# Patient Record
Sex: Female | Born: 1970
Health system: Southern US, Community
[De-identification: ages and names within clinical notes are randomized; demographics above are authoritative.]

## PROBLEM LIST (undated history)

## (undated) DIAGNOSIS — K5900 Constipation, unspecified: Secondary | ICD-10-CM

## (undated) DIAGNOSIS — F32A Depression, unspecified: Secondary | ICD-10-CM

## (undated) DIAGNOSIS — K635 Polyp of colon: Secondary | ICD-10-CM

## (undated) DIAGNOSIS — M1990 Unspecified osteoarthritis, unspecified site: Secondary | ICD-10-CM

## (undated) DIAGNOSIS — F329 Major depressive disorder, single episode, unspecified: Secondary | ICD-10-CM

## (undated) DIAGNOSIS — E039 Hypothyroidism, unspecified: Secondary | ICD-10-CM

## (undated) DIAGNOSIS — R7989 Other specified abnormal findings of blood chemistry: Secondary | ICD-10-CM

## (undated) DIAGNOSIS — T7840XA Allergy, unspecified, initial encounter: Secondary | ICD-10-CM

## (undated) DIAGNOSIS — F419 Anxiety disorder, unspecified: Secondary | ICD-10-CM

## (undated) DIAGNOSIS — M2550 Pain in unspecified joint: Secondary | ICD-10-CM

## (undated) DIAGNOSIS — J45909 Unspecified asthma, uncomplicated: Secondary | ICD-10-CM

## (undated) HISTORY — DX: Pain in unspecified joint: M25.50

## (undated) HISTORY — DX: Polyp of colon: K63.5

## (undated) HISTORY — DX: Constipation, unspecified: K59.00

## (undated) HISTORY — DX: Other specified abnormal findings of blood chemistry: R79.89

## (undated) HISTORY — PX: BREAST SURGERY: SHX581

## (undated) HISTORY — DX: Unspecified asthma, uncomplicated: J45.909

## (undated) HISTORY — DX: Depression, unspecified: F32.A

## (undated) HISTORY — DX: Unspecified osteoarthritis, unspecified site: M19.90

## (undated) HISTORY — PX: APPENDECTOMY: SHX54

## (undated) HISTORY — PX: ABDOMINAL HYSTERECTOMY: SHX81

## (undated) HISTORY — DX: Allergy, unspecified, initial encounter: T78.40XA

## (undated) HISTORY — DX: Anxiety disorder, unspecified: F41.9

---
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
## 2016-06-20 DIAGNOSIS — N63 Unspecified lump in unspecified breast: Secondary | ICD-10-CM | POA: Insufficient documentation

## 2017-05-30 DIAGNOSIS — N941 Unspecified dyspareunia: Secondary | ICD-10-CM | POA: Insufficient documentation

## 2018-09-14 DIAGNOSIS — F32A Depression, unspecified: Secondary | ICD-10-CM | POA: Insufficient documentation

## 2018-10-28 DIAGNOSIS — G4733 Obstructive sleep apnea (adult) (pediatric): Secondary | ICD-10-CM | POA: Insufficient documentation

## 2019-10-22 DIAGNOSIS — Z20822 Contact with and (suspected) exposure to covid-19: Secondary | ICD-10-CM | POA: Diagnosis not present

## 2019-10-22 DIAGNOSIS — R0982 Postnasal drip: Secondary | ICD-10-CM | POA: Diagnosis not present

## 2019-10-22 DIAGNOSIS — H6503 Acute serous otitis media, bilateral: Secondary | ICD-10-CM | POA: Diagnosis not present

## 2019-10-22 DIAGNOSIS — R0981 Nasal congestion: Secondary | ICD-10-CM | POA: Diagnosis not present

## 2019-11-04 DIAGNOSIS — Z1231 Encounter for screening mammogram for malignant neoplasm of breast: Secondary | ICD-10-CM | POA: Diagnosis not present

## 2019-11-05 DIAGNOSIS — Z79899 Other long term (current) drug therapy: Secondary | ICD-10-CM | POA: Diagnosis not present

## 2019-11-05 DIAGNOSIS — L732 Hidradenitis suppurativa: Secondary | ICD-10-CM | POA: Diagnosis not present

## 2019-11-05 DIAGNOSIS — Z5181 Encounter for therapeutic drug level monitoring: Secondary | ICD-10-CM | POA: Diagnosis not present

## 2019-11-05 DIAGNOSIS — L439 Lichen planus, unspecified: Secondary | ICD-10-CM | POA: Diagnosis not present

## 2019-11-23 DIAGNOSIS — M9902 Segmental and somatic dysfunction of thoracic region: Secondary | ICD-10-CM | POA: Diagnosis not present

## 2019-11-23 DIAGNOSIS — M5413 Radiculopathy, cervicothoracic region: Secondary | ICD-10-CM | POA: Diagnosis not present

## 2019-11-23 DIAGNOSIS — M791 Myalgia, unspecified site: Secondary | ICD-10-CM | POA: Diagnosis not present

## 2019-11-23 DIAGNOSIS — M9901 Segmental and somatic dysfunction of cervical region: Secondary | ICD-10-CM | POA: Diagnosis not present

## 2019-11-25 DIAGNOSIS — R928 Other abnormal and inconclusive findings on diagnostic imaging of breast: Secondary | ICD-10-CM | POA: Diagnosis not present

## 2019-12-07 DIAGNOSIS — M791 Myalgia, unspecified site: Secondary | ICD-10-CM | POA: Diagnosis not present

## 2019-12-07 DIAGNOSIS — M9901 Segmental and somatic dysfunction of cervical region: Secondary | ICD-10-CM | POA: Diagnosis not present

## 2019-12-07 DIAGNOSIS — M9902 Segmental and somatic dysfunction of thoracic region: Secondary | ICD-10-CM | POA: Diagnosis not present

## 2019-12-07 DIAGNOSIS — M5413 Radiculopathy, cervicothoracic region: Secondary | ICD-10-CM | POA: Diagnosis not present

## 2019-12-14 DIAGNOSIS — M9902 Segmental and somatic dysfunction of thoracic region: Secondary | ICD-10-CM | POA: Diagnosis not present

## 2019-12-14 DIAGNOSIS — M791 Myalgia, unspecified site: Secondary | ICD-10-CM | POA: Diagnosis not present

## 2019-12-14 DIAGNOSIS — M9901 Segmental and somatic dysfunction of cervical region: Secondary | ICD-10-CM | POA: Diagnosis not present

## 2019-12-14 DIAGNOSIS — M5413 Radiculopathy, cervicothoracic region: Secondary | ICD-10-CM | POA: Diagnosis not present

## 2019-12-23 DIAGNOSIS — E7800 Pure hypercholesterolemia, unspecified: Secondary | ICD-10-CM | POA: Diagnosis not present

## 2020-02-07 DIAGNOSIS — J441 Chronic obstructive pulmonary disease with (acute) exacerbation: Secondary | ICD-10-CM | POA: Diagnosis not present

## 2020-02-07 DIAGNOSIS — R05 Cough: Secondary | ICD-10-CM | POA: Diagnosis not present

## 2020-02-07 DIAGNOSIS — Z20822 Contact with and (suspected) exposure to covid-19: Secondary | ICD-10-CM | POA: Diagnosis not present

## 2020-02-10 ENCOUNTER — Other Ambulatory Visit: Payer: Self-pay

## 2020-02-10 ENCOUNTER — Encounter: Payer: Self-pay | Admitting: Osteopathic Medicine

## 2020-02-10 ENCOUNTER — Ambulatory Visit (INDEPENDENT_AMBULATORY_CARE_PROVIDER_SITE_OTHER): Payer: BC Managed Care – PPO | Admitting: Osteopathic Medicine

## 2020-02-10 ENCOUNTER — Ambulatory Visit (INDEPENDENT_AMBULATORY_CARE_PROVIDER_SITE_OTHER): Payer: BC Managed Care – PPO

## 2020-02-10 VITALS — BP 120/77 | HR 72 | Temp 98.0°F | Ht 63.0 in | Wt 224.0 lb

## 2020-02-10 DIAGNOSIS — Z800 Family history of malignant neoplasm of digestive organs: Secondary | ICD-10-CM | POA: Insufficient documentation

## 2020-02-10 DIAGNOSIS — E039 Hypothyroidism, unspecified: Secondary | ICD-10-CM

## 2020-02-10 DIAGNOSIS — Z86010 Personal history of colonic polyps: Secondary | ICD-10-CM

## 2020-02-10 DIAGNOSIS — G8929 Other chronic pain: Secondary | ICD-10-CM

## 2020-02-10 DIAGNOSIS — E782 Mixed hyperlipidemia: Secondary | ICD-10-CM

## 2020-02-10 DIAGNOSIS — M47816 Spondylosis without myelopathy or radiculopathy, lumbar region: Secondary | ICD-10-CM | POA: Insufficient documentation

## 2020-02-10 DIAGNOSIS — M545 Low back pain, unspecified: Secondary | ICD-10-CM

## 2020-02-10 DIAGNOSIS — R635 Abnormal weight gain: Secondary | ICD-10-CM

## 2020-02-10 DIAGNOSIS — J4520 Mild intermittent asthma, uncomplicated: Secondary | ICD-10-CM

## 2020-02-10 DIAGNOSIS — F32A Depression, unspecified: Secondary | ICD-10-CM

## 2020-02-10 DIAGNOSIS — R6889 Other general symptoms and signs: Secondary | ICD-10-CM

## 2020-02-10 DIAGNOSIS — Z90710 Acquired absence of both cervix and uterus: Secondary | ICD-10-CM | POA: Insufficient documentation

## 2020-02-10 DIAGNOSIS — Z87891 Personal history of nicotine dependence: Secondary | ICD-10-CM

## 2020-02-10 DIAGNOSIS — M5030 Other cervical disc degeneration, unspecified cervical region: Secondary | ICD-10-CM | POA: Diagnosis not present

## 2020-02-10 DIAGNOSIS — F419 Anxiety disorder, unspecified: Secondary | ICD-10-CM

## 2020-02-10 DIAGNOSIS — M533 Sacrococcygeal disorders, not elsewhere classified: Secondary | ICD-10-CM | POA: Diagnosis not present

## 2020-02-10 DIAGNOSIS — F329 Major depressive disorder, single episode, unspecified: Secondary | ICD-10-CM

## 2020-02-10 MED ORDER — BUDESONIDE-FORMOTEROL FUMARATE 160-4.5 MCG/ACT IN AERO
2.0000 | INHALATION_SPRAY | Freq: Two times a day (BID) | RESPIRATORY_TRACT | 3 refills | Status: DC
Start: 2020-02-10 — End: 2022-05-14

## 2020-02-10 MED ORDER — LEVOTHYROXINE SODIUM 100 MCG PO TABS: 100.0000 ug | ORAL_TABLET | Freq: Every day | ORAL | 1 refills | Status: AC

## 2020-02-10 MED ORDER — ALBUTEROL SULFATE HFA 108 (90 BASE) MCG/ACT IN AERS: 1.0000 | INHALATION_SPRAY | Freq: Four times a day (QID) | RESPIRATORY_TRACT | 99 refills | Status: DC | PRN

## 2020-02-10 NOTE — Progress Notes (Signed)
Rakia Frayne is a 49 y.o. female who presents to  [place] at [facility]  today, 02/10/20, seeking care for the following: . New to establish  . Neck and back pain - chronic  . Asthma - recently quit smoking!      ASSESSMENT & PLAN with other pertinent history/findings:  The primary encounter diagnosis was Hypothyroidism, unspecified type. Diagnoses of History of colon polyps, Anxiety and depression, Mild intermittent asthma without complication, Abnormal weight gain, Disc disease, degenerative, cervical, Heat intolerance, Chronic low back pain, unspecified back pain laterality, unspecified whether sciatica present, Former smoker, H/O: hysterectomy, Family history of colon cancer in father, and Mixed hyperlipidemia were also pertinent to this visit.     Patient Instructions  Neck / Back  Xray lumbar spine and SI joints today  Physical therapy referral for dry needling  Consider MRI C-spine (neck) and L-spine (lower back)   Asthma Stay off cigarettes! Congrats on quitting!  Refilled ProAir (or any albuterol is ok) Starting Symbicort inhaler twice daily for 6 weeks  At specialists / mammogram / colonoscopy: Please ask them to send me records!  Our fax: [phone]   Welcome to [facility]!       Orders Placed This Encounter  Procedures  . DG Lumbar Spine Complete  . DG Si Joints  . Ambulatory referral to Physical Therapy    Meds ordered this encounter  Medications  . budesonide-formoterol (SYMBICORT) 160-4.5 MCG/ACT inhaler    Sig: Inhale 2 puffs into the lungs 2 (two) times daily.    Dispense:  1 Inhaler    Refill:  3  . albuterol (VENTOLIN HFA) 108 (90 Base) MCG/ACT inhaler    Sig: Inhale 1-2 puffs into the lungs every 6 (six) hours as needed for wheezing or shortness of breath.    Dispense:  18 g    Refill:  99  . levothyroxine (SYNTHROID) 100 MCG tablet    Sig: Take 1 tablet (100 mcg total) by  mouth daily before breakfast.    Dispense:  90 tablet    Refill:  1       Follow-up instructions: Return for recheck breathing in 6 weeks, othrewise as needed for neck/back if no better, annual w/ labs 10/2020.                                         BP 120/77 (BP Location: Right Arm, Patient Position: Sitting)   Pulse 72   Temp 98 F (36.7 C)   Ht 5\' 3"  (1.6 m)   Wt 224 lb (101.6 kg)   SpO2 98%   BMI 39.68 kg/m   Current Meds  Medication Sig  . acetaminophen (TYLENOL) 500 MG tablet Take 500 mg by mouth every 6 (six) hours as needed.  [name] estradiol (VIVELLE-DOT) 0.05 MG/24HR patch Place 1 patch onto the skin once a week.  . Fish Oil-Cholecalciferol (FISH OIL + D3) 1000-1000 MG-UNIT CAPS Take by mouth.  [name] ibuprofen (ADVIL) 200 MG tablet Take 200 mg by mouth every 6 (six) hours as needed.  [name] levothyroxine (SYNTHROID) 100 MCG tablet Take 1 tablet (100 mcg total) by mouth daily before breakfast.  . [DISCONTINUED] levothyroxine (SYNTHROID) 100 MCG tablet Take 100 mcg by mouth daily before breakfast.    No results found for this or any previous visit (from the past 72 hour(s)).  No results  found.  Depression screen [facility] 2/9 02/10/2020  Decreased Interest 0  Down, Depressed, Hopeless 0  PHQ - 2 Score 0  Altered sleeping 1  Tired, decreased energy 1  Change in appetite 1  Feeling bad or failure about yourself  0  Trouble concentrating 0  Moving slowly or fidgety/restless 0  Suicidal thoughts 0  PHQ-9 Score 3  Difficult doing work/chores Not difficult at all    GAD 7 : Generalized Anxiety Score 02/10/2020  Nervous, Anxious, on Edge 1  Control/stop worrying 0  Worry too much - different things 1  Trouble relaxing 0  Restless 0  Easily annoyed or irritable 0  Afraid - awful might happen 0  Total GAD 7 Score 2  Anxiety Difficulty Not difficult at all    Constitutional:  . VSS, see nurse notes . General Appearance: alert,  well-developed, well-nourished, NAD Eyes: [name] Normal lids and conjunctive, non-icteric sclera . PERRLA Ears, Nose, Mouth, Throat: . Normal appearanc Neck: . No masses, trachea midline . No thyroid enlargement/tenderness/mass appreciated Respiratory: . Normal respiratory effort . (+)diffuse wheeze in all lung fields  Cardiovascular: . S1/S2 normal, no murmur/rub/gallop auscultated . No lower extremity edema Gastrointestinal: . Nontender, no masses . No hepatomegaly, no splenomegaly . No hernia appreciated Musculoskeletal:  . Gait normal . No clubbing/cyanosis of digits Neurological: . No cranial nerve deficit on limited exam . Motor and sensation intact and symmetric Psychiatric: . Normal judgment/insight . Normal mood and affect   All questions at time of visit were answered - patient instructed to contact office with any additional concerns or updates.  ER/RTC precautions were reviewed with the patient.  Please note: voice recognition software was used to produce this document, and typos may escape review. Please contact Dr. Lyn Hollingshead for any needed clarifications.

## 2020-02-10 NOTE — Patient Instructions (Signed)
Neck / Back  Xray lumbar spine and SI joints today  Physical therapy referral for dry needling  Consider MRI C-spine (neck) and L-spine (lower back)   Asthma Stay off cigarettes! Congrats on quitting!  Refilled ProAir (or any albuterol is ok) Starting Symbicort inhaler twice daily for 6 weeks  At specialists / mammogram / colonoscopy: Please ask them to send me records!  Our fax: [phone]   Welcome to [facility]!

## 2020-02-16 ENCOUNTER — Encounter: Payer: Self-pay | Admitting: Osteopathic Medicine

## 2020-02-17 DIAGNOSIS — K635 Polyp of colon: Secondary | ICD-10-CM | POA: Diagnosis not present

## 2020-02-17 DIAGNOSIS — D125 Benign neoplasm of sigmoid colon: Secondary | ICD-10-CM | POA: Diagnosis not present

## 2020-02-17 DIAGNOSIS — Z86010 Personal history of colonic polyps: Secondary | ICD-10-CM | POA: Diagnosis not present

## 2020-02-17 LAB — HM COLONOSCOPY

## 2020-02-22 ENCOUNTER — Other Ambulatory Visit: Payer: Self-pay

## 2020-02-22 ENCOUNTER — Encounter: Payer: Self-pay | Admitting: Rehabilitative and Restorative Service Providers"

## 2020-02-22 ENCOUNTER — Ambulatory Visit (INDEPENDENT_AMBULATORY_CARE_PROVIDER_SITE_OTHER): Payer: BC Managed Care – PPO | Admitting: Rehabilitative and Restorative Service Providers"

## 2020-02-22 DIAGNOSIS — M545 Low back pain, unspecified: Secondary | ICD-10-CM

## 2020-02-22 DIAGNOSIS — M542 Cervicalgia: Secondary | ICD-10-CM

## 2020-02-22 DIAGNOSIS — M6281 Muscle weakness (generalized): Secondary | ICD-10-CM

## 2020-02-22 DIAGNOSIS — R293 Abnormal posture: Secondary | ICD-10-CM

## 2020-02-22 DIAGNOSIS — G8929 Other chronic pain: Secondary | ICD-10-CM

## 2020-02-22 DIAGNOSIS — R29898 Other symptoms and signs involving the musculoskeletal system: Secondary | ICD-10-CM | POA: Diagnosis not present

## 2020-02-22 NOTE — Patient Instructions (Signed)
Access Code: [email or web address]: [email or web address]: 06/15/2021Prepared by: Jerret Mcbane HoltExercises  Hip Flexor Stretch at Edge of Bed - 2 x daily - 7 x weekly - 3 reps - 1 sets - 30 sec hold  Supine Piriformis Stretch with Leg Straight - 2 x daily - 7 x weekly - 3 reps - 1 sets - 30 sec hold  Supine ITB Stretch with Strap - 2 x daily - 7 x weekly - 3 reps - 1 sets - 30 sec hold  Prone Press Up - 2 x daily - 7 x weekly - 1 sets - 3 reps - 2-3 sec hold  Standing Lumbar Extension - 4-5 x daily - 7 x weekly - 1 sets - 3 reps - 2-3 sec hold Patient Education  TENS Unit

## 2020-02-22 NOTE — Therapy (Signed)
[facility] [place] [address] [place], [place], [address] Phone: [phone]   Fax:  [phone]  Physical Therapy Evaluation  Patient Details  Name: Alexandra Greer MRN: [id number] Date of Birth: 1971-04-24 Referring Provider (PT): Dr Sunnie Nielsen    Encounter Date: 02/22/2020   PT End of Session - 02/22/20 0924    Visit Number 1    Number of Visits 12    Date for PT Re-Evaluation 04/04/20    PT Start Time 0842    PT Stop Time 0939    PT Time Calculation (min) 57 min    Activity Tolerance Patient tolerated treatment well           Past Medical History:  Diagnosis Date  . Anxiety   . Asthma   . Colon polyps   . Depression   . Low thyroid stimulating hormone (TSH) level     History reviewed. No pertinent surgical history.  There were no vitals filed for this visit.    Subjective Assessment - 02/22/20 0845    Subjective Patient reports onset of neck pain in 2012 with no known injury. Pain has persisted with no significant changes. She has episodic flare ups with increased pain radiating into her head. Stress increases neck pain. LBP present for the past 2 years with no known injury. 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    Pertinent History C-section; lumpectomy    Diagnostic tests xrays    Patient Stated Goals find some relief for LBP and help manage neck pain    Currently in Pain? Yes    Pain Score 0-No pain    Pain Location Neck    Pain Orientation Right;Posterior;Upper    Pain Descriptors / Indicators Sharp;Dull    Pain Type Chronic pain    Pain Radiating Towards headaches    Pain Onset More than a month ago    Pain Frequency Intermittent    Aggravating Factors  starts when she awakens but not daily    Pain Relieving Factors ice packs; OTC meds    Multiple Pain Sites Yes    Pain Score 5   increased with walking   Pain Location Back    Pain Orientation Right;Left;Posterior;Lower     Pain Descriptors / Indicators Burning;Throbbing    Pain Type Chronic pain    Pain Radiating Towards Rt SI - across bilat hips radiates Rt to Lt    Pain Onset More than a month ago    Pain Frequency Intermittent    Aggravating Factors  standing > 2-3 min; walking > 3-4 min; bending; lifting; squatting    Pain Relieving Factors sitting              [facility] PT Assessment - 02/22/20 0001      Assessment   Medical Diagnosis Cervicalgia; LBP     Referring Provider (PT) Dr Sunnie Nielsen     Onset Date/Surgical Date 02/01/20   neck pain since 2012; LBP x 2 yrs    Hand Dominance Right    Next MD Visit 03/14/20    Prior Therapy for neck ~ 1 yr ago       Precautions   Precautions None      Balance Screen   Has the patient fallen in the past 6 months No    Has the patient had a decrease in activity level because of a fear of falling?  No    Is the patient reluctant to leave their home  because of a fear of falling?  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     Leisure household chores; getting ready to move; otherwise sedentary - does like to "swim" in pool 1-2 days/week (just floating and relaxing)       Observation/Other Assessments   Focus on Therapeutic Outcomes (FOTO)  37% limitation       Sensation   Additional Comments WFL's       Posture/Postural Control   Posture Comments head forward; shoulders rounded and elevated; flexed forward at hips; Rt knee flexed       AROM   Cervical Flexion 58   pulling upper trap Rt    Cervical Extension 41    Cervical - Right Side Bend 36    Cervical - Left Side Bend 36   pulling Rt shoulder blade   Cervical - Right Rotation 60    Cervical - Left Rotation 67    Lumbar Flexion 90% pain with  bending and return to stand    Lumbar Extension 60% decreased pain     Lumbar - Right Side Bend 85% pain Rt SI/LB    Lumbar - Left Side Bend 85% no pain     Lumbar - Right Rotation 55% no pain     Lumbar - Left Rotation 55% no pain       Strength   Right/Left Shoulder --   WFL's bilat    Right Hip Flexion 4+/5    Right Hip Extension 4/5   painful    Right Hip ABduction 4+/5    Left Hip Flexion 4+/5    Left Hip Extension 4+/5    Left Hip ABduction 4+/5      Flexibility   Soft Tissue Assessment /Muscle Length --   tight hip flexors bilat    Hamstrings WFL's     Quadriceps WFL's - pulling in Rt SI area with Lt knee flexion     ITB tight bilat     Piriformis tight bilat       Palpation   Spinal mobility hypomobility lumbar spine with CPA mobs     SI assessment  discomfort with palpation through the Rt SI area     Palpation comment tightness Rt QL/lats; lumbar paraspinals       Special Tests   Other special tests (-) SLR; FABERS       Balance   Balance Assessed --   SLS ~ 10 sec each LE                      Objective measurements completed on examination: See above findings.       [place] Adult PT Treatment/Exercise - 02/22/20 0001      Self-Care   Self-Care --   initiated back care eduction     Lumbar Exercises: Stretches   Hip Flexor Stretch Right;Left;3 reps;30 seconds   thomas position bringing bilat knees to chest 1st   ITB Stretch Right;Left;3 reps;30 seconds   supine with strap    Piriformis Stretch Right;Left;3 reps;30 seconds   supine travell with strap      Cryotherapy   Number Minutes Cryotherapy 10 Minutes    Cryotherapy Location Lumbar Spine;Hip  Type of Cryotherapy Ice pack      Electrical Stimulation   Electrical Stimulation Location bilat lumbar to hips    Electrical Stimulation Action IFC    Electrical Stimulation Parameters to tolerance     Electrical Stimulation Goals Pain;Tone                  PT Education - 02/22/20  4132    Education Details HEP; TENS; POC    Person(s) Educated Patient    Methods Explanation;Demonstration;Tactile cues;Verbal cues;Handout    Comprehension Verbalized understanding;Returned demonstration;Verbal cues required;Tactile cues required               PT Long Term Goals - 02/22/20 1301      PT LONG TERM GOAL #1   Title Increase spinal mobilty and trunk ROM with patient demonstrating WFL's and minimal to no pain with lumbar motions    Time 6    Period Weeks    Status New    Target Date 04/04/20      PT LONG TERM GOAL #2   Title Increase LE strength 1/2 to 1 muscle grade    Time 6    Period Weeks    Status New    Target Date 04/04/20      PT LONG TERM GOAL #3   Title Decrease frequency, intensity, or duration of pain by 25-50% allowing patient to participate in exercise program for at leat 20 min on a daily basis with minimal to no increase in pain    Time 6    Period Weeks    Status New    Target Date 04/04/20      PT LONG TERM GOAL #4   Title Patient to demonstrate increased cervical ROM by 3-7 degrees in lateral flexion and rotation with no change in pain    Time 6    Period Weeks    Status New    Target Date 04/04/20      PT LONG TERM GOAL #5   Title Improve posture and alignment with patient to demonstrate improved upright posture with posterior shoulder girdle musculature engaged    Time 6    Period Weeks    Status New    Target Date 04/04/20      Additional Long Term Goals   Additional Long Term Goals Yes      PT LONG TERM GOAL #6   Title Independent in aquatic exercise program to transition to community or private pool    Time 6    Period Weeks    Status New    Target Date 04/04/20      PT LONG TERM GOAL #7   Title Independent in HEP    Time 6    Period Weeks    Status New    Target Date 04/04/20      PT LONG TERM GOAL #8   Title Improve FOTO to </= 35% limitation    Time 6    Period Weeks    Status New    Target Date 04/04/20                    Plan - 02/22/20 1253    Clinical Impression Statement Patient presents with c/o chronic pain in neck and low back. Neck pain has been present for ~ 10 years and LBP for ~ 2 years with no known cause of pain. She has intermittent symptoms with very limited functional abilities. Patient has poor posture and alignment; limited  spinal mobility and ROM; muscular tightness; core and LE weakness; muscular tightness to palpation; pain; sedentary lifestyle; limited ADL's and funcitonal activity level. Patient will benefit from PT to address problems identified.    Personal Factors and Comorbidities Behavior Pattern;Comorbidity 1;Comorbidity 2;Comorbidity 3+    Comorbidities chronic pain; obesity; sedentary lifestyle    Examination-Activity Limitations Locomotion Level;Stand;Squat;Sleep;Lift;Bend    Examination-Participation Restrictions Cleaning;Community Activity;Meal Prep;Yard Work    Conservation officer, historic buildings Evolving/Moderate complexity    Clinical Decision Making Moderate    Rehab Potential Good    PT Frequency 2x / week    PT Duration 6 weeks    PT Treatment/Interventions Patient/family education;ADLs/Self Care Home Management;Cryotherapy;Electrical Stimulation;Iontophoresis 4mg /ml Dexamethasone;Moist Heat;Traction;Gait training;Functional mobility training;Therapeutic activities;Therapeutic exercise;Balance training;Neuromuscular re-education;Manual techniques;Dry needling;Taping    PT Next Visit Plan review HEP; further assess SI as indicated; progress with stretching; strengthening; core stabilization; postural correction; back care/spine eduction; aquatic therapy(has access to pool); manual work and modalities as indicated (trial of estim in clinic for evaluation for TENS unit at home)    PT Home Exercise Plan [facility]    Recommended Other Services nutritionist; counselor for pain management    Consulted and Agree with Plan of Care Patient           Patient  will benefit from skilled therapeutic intervention in order to improve the following deficits and impairments:  Decreased range of motion, Increased fascial restricitons, Obesity, Decreased activity tolerance, Pain, Hypomobility, Impaired flexibility, Improper body mechanics, Decreased mobility, Decreased strength, Postural dysfunction  Visit Diagnosis: Cervicalgia - Plan: PT plan of care cert/re-cert  Chronic bilateral low back pain without sciatica - Plan: PT plan of care cert/re-cert  Other symptoms and signs involving the musculoskeletal system - Plan: PT plan of care cert/re-cert  Abnormal posture - Plan: PT plan of care cert/re-cert  Muscle weakness (generalized) - Plan: PT plan of care cert/re-cert     Problem List Patient Active Problem List   Diagnosis Date Noted  . Family history of colon cancer in father 02/10/2020  . H/O: hysterectomy 02/10/2020  . Former smoker 02/10/2020  . Chronic low back pain 02/10/2020  . Disc disease, degenerative, cervical 02/10/2020  . Mild intermittent asthma without complication 02/10/2020  . Mild obstructive sleep apnea 10/28/2018  . Anxiety and depression 09/14/2018  . Dyspareunia, female 05/30/2017  . Subcutaneous nodule of breast 06/20/2016  . Hypothyroidism 09/12/2011    Alexandra Greer 11/10/2011 PT, MPH  02/22/2020, 1:10 PM  [facility] [address] [place], [place], [place] Phone: [phone]   Fax:  [phone]  Name: Alexandra Greer MRN: [id number] Platt Date of Birth: 1971/01/09

## 2020-02-24 ENCOUNTER — Ambulatory Visit (INDEPENDENT_AMBULATORY_CARE_PROVIDER_SITE_OTHER): Payer: BC Managed Care – PPO | Admitting: Physical Therapy

## 2020-02-24 ENCOUNTER — Encounter: Payer: Self-pay | Admitting: Osteopathic Medicine

## 2020-02-24 ENCOUNTER — Other Ambulatory Visit: Payer: Self-pay

## 2020-02-24 DIAGNOSIS — R293 Abnormal posture: Secondary | ICD-10-CM | POA: Diagnosis not present

## 2020-02-24 DIAGNOSIS — M545 Low back pain: Secondary | ICD-10-CM

## 2020-02-24 DIAGNOSIS — M542 Cervicalgia: Secondary | ICD-10-CM | POA: Diagnosis not present

## 2020-02-24 DIAGNOSIS — R29898 Other symptoms and signs involving the musculoskeletal system: Secondary | ICD-10-CM | POA: Diagnosis not present

## 2020-02-24 DIAGNOSIS — M6281 Muscle weakness (generalized): Secondary | ICD-10-CM

## 2020-02-24 DIAGNOSIS — G8929 Other chronic pain: Secondary | ICD-10-CM

## 2020-02-24 NOTE — Patient Instructions (Addendum)
Sleeping on Back  Place pillow under knees. A pillow with cervical support and a roll around waist are also helpful. Copyright  VHI. All rights reserved.  Sleeping on Side Place pillow between knees. Use cervical support under neck and a roll around waist as needed. Copyright  VHI. All rights reserved.   Sleeping on Stomach   If this is the only desirable sleeping position, place pillow under lower legs, and under stomach or chest as needed.  Posture - Sitting   Sit upright, head facing forward. Try using a roll to support lower back. Keep shoulders relaxed, and avoid rounded back. Keep hips level with knees. Avoid crossing legs for long periods. Stand to Sit / Sit to Stand   To sit: Bend knees to lower self onto front edge of chair, then scoot back on seat. To stand: Reverse sequence by placing one foot forward, and scoot to front of seat. Use rocking motion to stand up.   Work Height and Reach  Ideal work height is no more than 2 to 4 inches below elbow level when standing, and at elbow level when sitting. Reaching should be limited to arm's length, with elbows slightly bent.  Bending  Bend at hips and knees, not back. Keep feet shoulder-width apart.    Posture - Standing   Good posture is important. Avoid slouching and forward head thrust. Maintain curve in low back and align ears over shoul- ders, hips over ankles.  Alternating Positions   Alternate tasks and change positions frequently to reduce fatigue and muscle tension. Take rest breaks. Computer Work   Position work to face forward. Use proper work and seat height. Keep shoulders back and down, wrists straight, and elbows at right angles. Use chair that provides full back support. Add footrest and lumbar roll as needed.  Getting Into / Out of Car  Lower self onto seat, scoot back, then bring in one leg at a time. Reverse sequence to get out.  Dressing  Lie on back to pull socks or slacks over feet, or sit  and bend leg while keeping back straight.    Housework - Sink  Place one foot on ledge of cabinet under sink when standing at sink for prolonged periods.   Pushing / Pulling  Pushing is preferable to pulling. Keep back in proper alignment, and use leg muscles to do the work.  Deep Squat   Squat and lift with both arms held against upper trunk. Tighten stomach muscles without holding breath. Use smooth movements to avoid jerking.  Avoid Twisting   Avoid twisting or bending back. Pivot around using foot movements, and bend at knees if needed when reaching for articles.  Carrying Luggage   Distribute weight evenly on both sides. Use a cart whenever possible. Do not twist trunk. Move body as a unit.   Lifting Principles .Maintain proper posture and head alignment. .Slide object as close as possible before lifting. .Move obstacles out of the way. .Test before lifting; ask for help if too heavy. .Tighten stomach muscles without holding breath. .Use smooth movements; do not jerk. .Use legs to do the work, and pivot with feet. .Distribute the work load symmetrically and close to the center of trunk. .Push instead of pull whenever possible.   Ask For Help   Ask for help and delegate to others when possible. Coordinate your movements when lifting together, and maintain the low back curve.  Log Roll   Lying on back, bend left knee and place left   arm across chest. Roll all in one movement to the right. Reverse to roll to the left. Always move as one unit. Housework - Sweeping  Use long-handled equipment to avoid stooping.   Housework - Wiping  Position yourself as close as possible to reach work surface. Avoid straining your back.  Laundry - Unloading Wash   To unload small items at bottom of washer, lift leg opposite to arm being used to reach.  Gardening - Raking  Move close to area to be raked. Use arm movements to do the work. Keep back straight and avoid  twisting.     Cart  When reaching into cart with one arm, lift opposite leg to keep back straight.   Getting Into / Out of Bed  Lower self to lie down on one side by raising legs and lowering head at the same time. Use arms to assist moving without twisting. Bend both knees to roll onto back if desired. To sit up, start from lying on side, and use same move-ments in reverse. Housework - Vacuuming  Hold the vacuum with arm held at side. Step back and forth to move it, keeping head up. Avoid twisting.   Laundry - Armed forces training and education officer so that bending and twisting can be avoided.   Laundry - Unloading Dryer  Squat down to reach into clothes dryer or use a reacher.  Gardening - Weeding / Psychiatric nurse or Kneel. Knee pads may be helpful.                   Access Code: [email or web address]: [email or web address]: 06/17/2021Prepared by: [facility] [place]ern[place]villeExercises  Hip Flexor Stretch at [place] of Bed - 2 x daily - 7 x weekly - 2-3 reps - 1 sets - 20 sec hold  Seated Hip Flexor Stretch - 1 x daily - 7 x weekly - 3 sets - 2-3 reps - 20 sec hold  Supine ITB Stretch with Strap - 2 x daily - 7 x weekly - 3 reps - 1 sets - 30 sec hold  Prone Press Up - 2 x daily - 7 x weekly - 1 sets - 3 reps - 2-3 sec hold  Standing Lumbar Extension - 4-5 x daily - 7 x weekly - 1 sets - 3 reps - 2-3 sec hold  Seated Table Piriformis Stretch - 1 x daily - 7 x weekly - 3 sets - 10 reps - 20 hold  Doorway Pec Stretch at 90 Degrees Abduction - 1 x daily - 7 x weekly - 3 sets - 2-3 reps - 20 hold

## 2020-02-24 NOTE — Therapy (Signed)
[place], [place], [address] Phone: [phone]   Fax:  [phone]  Physical Therapy Treatment  Patient Details  Name: Alexandra Greer MRN: [id number] Date of Birth: [birth year] Referring Provider (PT): Dr Emeterio Reeve    Encounter Date: 02/24/2020   PT End of Session - 02/24/20 0946    Visit Number 2    Number of Visits 12    Date for PT Re-Evaluation 04/04/20    PT Start Time 0930    PT Stop Time 1020   MHP last 10 min   PT Time Calculation (min) 50 min    Activity Tolerance Patient tolerated treatment well    Behavior During Therapy [facility] for tasks assessed/performed           Past Medical History:  Diagnosis Date  . Anxiety   . Asthma   . Colon polyps   . Depression   . Low thyroid stimulating hormone (TSH) level     No past surgical history on file.  There were no vitals filed for this visit.   Subjective Assessment - 02/24/20 0934    Subjective Pt reports she had no pain when she walked in door.  She performed exercises 1x yesterday. 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    Currently in Pain? No/denies    Pain Score 0-No pain              [facility] PT Assessment - 02/24/20 0001      Assessment   Medical Diagnosis Cervicalgia; LBP     Referring Provider (PT) Dr Emeterio Reeve     Onset Date/Surgical Date 02/01/20   neck pain since 2012; LBP x 2 yrs    Hand Dominance Right    Next MD Visit 03/14/20    Prior Therapy for neck ~ 1 yr ago            [facility] Adult PT Treatment/Exercise - 02/24/20 0001      Self-Care   Self-Care Other Self-Care Comments    Other Self-Care Comments  Pt educated in self massage with ball to hip and lumbar musculature. Pt returned demo with cues.       Lumbar Exercises: Stretches   Hip Flexor Stretch Right;Left;2 reps;30 seconds   arm overhead     Standing Extension 1 rep;5 seconds    ITB Stretch Right;Left;2 reps;20 seconds   supine   Piriformis Stretch Right;Left;2 reps;30 seconds    Piriformis Stretch Limitations Trevell pinched in groin, switched to fig 4 holding opp leg and modified pigeon pose     Other Lumbar Stretch Exercise adductor stretch x 30 sec x 2 reps each leg in supine with strap      Lumbar Exercises: Aerobic   Nustep L5: 5 min (arms/legs)      Lumbar Exercises: Seated   Other Seated Lumbar Exercises reviewed sit to/from supine via log roll x 3 reps with cues      Shoulder Exercises: Stretch   Other Shoulder Stretches midlevel doorway stretch x 20 sec x 2 reps      Modalities   Modalities Moist Heat      Moist Heat Therapy   Number Minutes Moist Heat 10 Minutes    Moist Heat Location Lumbar Spine                  PT Education - 02/24/20  1320    Education Details posture/body Geologist, engineering; HEP updated    Person(s) Educated Patient    Methods Explanation;Handout    Comprehension Verbalized understanding               PT Long Term Goals - 02/22/20 1301      PT LONG TERM GOAL #1   Title Increase spinal mobilty and trunk ROM with patient demonstrating WFL's and minimal to no pain with lumbar motions    Time 6    Period Weeks    Status New    Target Date 04/04/20      PT LONG TERM GOAL #2   Title Increase LE strength 1/2 to 1 muscle grade    Time 6    Period Weeks    Status New    Target Date 04/04/20      PT LONG TERM GOAL #3   Title Decrease frequency, intensity, or duration of pain by 25-50% allowing patient to participate in exercise program for at leat 20 min on a daily basis with minimal to no increase in pain    Time 6    Period Weeks    Status New    Target Date 04/04/20      PT LONG TERM GOAL #4   Title Patient to demonstrate increased cervical ROM by 3-7 degrees in lateral flexion and rotation with no change in pain    Time 6    Period Weeks    Status New    Target  Date 04/04/20      PT LONG TERM GOAL #5   Title Improve posture and alignment with patient to demonstrate improved upright posture with posterior shoulder girdle musculature engaged    Time 6    Period Weeks    Status New    Target Date 04/04/20      Additional Long Term Goals   Additional Long Term Goals Yes      PT LONG TERM GOAL #6   Title Independent in aquatic exercise program to transition to community or private pool    Time 6    Period Weeks    Status New    Target Date 04/04/20      PT LONG TERM GOAL #7   Title Independent in HEP    Time 6    Period Weeks    Status New    Target Date 04/04/20      PT LONG TERM GOAL #8   Title Improve FOTO to </= 35% limitation    Time 6    Period Weeks    Status New    Target Date 04/04/20                 Plan - 02/24/20 1016    Clinical Impression Statement Pt tolerated all exercises well.  She had some increase in pain in Rt glute med when riding NuStep, reduced with self massage and MHP.  Pt would benefit from ed on body mechanics for elder care and initiate core strengthening.    Personal Factors and Comorbidities Behavior Pattern;Comorbidity 1;Comorbidity 2;Comorbidity 3+    Comorbidities chronic pain; obesity; sedentary lifestyle    Examination-Activity Limitations Locomotion Level;Stand;Squat;Sleep;Lift;Bend    Examination-Participation Restrictions Cleaning;Community Activity;Meal Prep;Yard Work    Conservation officer, historic buildings Evolving/Moderate complexity    Rehab Potential Good    PT Frequency 2x / week    PT Duration 6 weeks    PT Treatment/Interventions Patient/family education;ADLs/Self Care Home Management;Cryotherapy;Electrical Stimulation;Iontophoresis 4mg /ml Dexamethasone;Moist Heat;Traction;Gait training;Functional  mobility training;Therapeutic activities;Therapeutic exercise;Balance training;Neuromuscular re-education;Manual techniques;Dry needling;Taping    PT Next Visit Plan TENS instruction;  core strengthening.    PT Home Exercise Plan [email or web address]    Consulted and Agree with Plan of Care Patient           Patient will benefit from skilled therapeutic intervention in order to improve the following deficits and impairments:  Decreased range of motion, Increased fascial restricitons, Obesity, Decreased activity tolerance, Pain, Hypomobility, Impaired flexibility, Improper body mechanics, Decreased mobility, Decreased strength, Postural dysfunction  Visit Diagnosis: Cervicalgia  Chronic bilateral low back pain without sciatica  Other symptoms and signs involving the musculoskeletal system  Abnormal posture  Muscle weakness (generalized)     Problem List Patient Active Problem List   Diagnosis Date Noted  . Family history of colon cancer in father 02/10/2020  . H/O: hysterectomy 02/10/2020  . Former smoker 02/10/2020  . Chronic low back pain 02/10/2020  . Disc disease, degenerative, cervical 02/10/2020  . Mild intermittent asthma without complication 02/10/2020  . Mild obstructive sleep apnea 10/28/2018  . Anxiety and depression 09/14/2018  . Dyspareunia, female 05/30/2017  . Subcutaneous nodule of breast 06/20/2016  . Hypothyroidism 09/12/2011   Mayer Camel, PTA 02/24/20 1:29 PM  [facility] [place] [address] [place], [place], [address] Phone: [phone]   Fax:  [phone]  Name: Alexandra Greer MRN: [id number] Date of Birth: [birth year]

## 2020-02-25 DIAGNOSIS — Z8742 Personal history of other diseases of the female genital tract: Secondary | ICD-10-CM | POA: Diagnosis not present

## 2020-02-25 DIAGNOSIS — Z79899 Other long term (current) drug therapy: Secondary | ICD-10-CM | POA: Diagnosis not present

## 2020-02-29 ENCOUNTER — Encounter: Payer: Self-pay | Admitting: Osteopathic Medicine

## 2020-02-29 ENCOUNTER — Ambulatory Visit (INDEPENDENT_AMBULATORY_CARE_PROVIDER_SITE_OTHER): Payer: BC Managed Care – PPO | Admitting: Rehabilitative and Restorative Service Providers"

## 2020-02-29 ENCOUNTER — Encounter: Payer: Self-pay | Admitting: Rehabilitative and Restorative Service Providers"

## 2020-02-29 ENCOUNTER — Other Ambulatory Visit: Payer: Self-pay

## 2020-02-29 DIAGNOSIS — M542 Cervicalgia: Secondary | ICD-10-CM | POA: Diagnosis not present

## 2020-02-29 DIAGNOSIS — G8929 Other chronic pain: Secondary | ICD-10-CM

## 2020-02-29 DIAGNOSIS — R29898 Other symptoms and signs involving the musculoskeletal system: Secondary | ICD-10-CM | POA: Diagnosis not present

## 2020-02-29 DIAGNOSIS — R293 Abnormal posture: Secondary | ICD-10-CM | POA: Diagnosis not present

## 2020-02-29 DIAGNOSIS — M6281 Muscle weakness (generalized): Secondary | ICD-10-CM

## 2020-02-29 DIAGNOSIS — M545 Low back pain, unspecified: Secondary | ICD-10-CM

## 2020-02-29 NOTE — Patient Instructions (Signed)
Access Code: [email or web address] URL: [email or web address] Date: 02/29/2020 Prepared by: Margretta Ditty  Exercises Supine ITB Stretch with Strap - 2 x daily - 7 x weekly - 3 reps - 1 sets - 30 sec hold Hip Flexor Stretch at Edge of Bed - 2 x daily - 7 x weekly - 2-3 reps - 1 sets - 20 sec hold Supine Bridge - 2 x daily - 7 x weekly - 1 sets - 10 reps Sidelying Hip Abduction - 2 x daily - 7 x weekly - 1 sets - 10 reps Prone Press Up - 2 x daily - 7 x weekly - 1 sets - 3 reps - 2-3 sec hold Seated Table Piriformis Stretch - 1 x daily - 7 x weekly - 3 sets - 10 reps - 20 hold Seated Hip Flexor Stretch - 1 x daily - 7 x weekly - 3 sets - 2-3 reps - 20 sec hold Standing Lumbar Extension - 4-5 x daily - 7 x weekly - 1 sets - 3 reps - 2-3 sec hold Doorway Pec Stretch at 90 Degrees Abduction - 1 x daily - 7 x weekly - 3 sets - 2-3 reps - 20 hold

## 2020-02-29 NOTE — Therapy (Signed)
[place], [place], [address] Phone: [phone]   Fax:  [phone]  Physical Therapy Treatment  Patient Details  Name: Alexandra Greer MRN: [id number] Date of Birth: [birth year] Referring Provider (PT): Dr Emeterio Reeve    Encounter Date: 02/29/2020   PT End of Session - 02/29/20 1104    Visit Number 3    Number of Visits 12    Date for PT Re-Evaluation 04/04/20    PT Start Time 1101    PT Stop Time 1145    PT Time Calculation (min) 44 min    Activity Tolerance Patient tolerated treatment well    Behavior During Therapy [facility] for tasks assessed/performed           Past Medical History:  Diagnosis Date  . Anxiety   . Asthma   . Colon polyps   . Depression   . Low thyroid stimulating hormone (TSH) level     History reviewed. No pertinent surgical history.  There were no vitals filed for this visit.   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  She is not hurting today.  She worked for 3 hours yesterday and was more mindful of her mechanics.    Pertinent History C-section; lumpectomy    Patient Stated Goals find some relief for LBP and help manage neck pain    Currently in Pain? No/denies                             [facility] Adult PT Treatment/Exercise - 02/29/20 1107      Self-Care   Self-Care Other Self-Care Comments    Other Self-Care Comments  discussed body mechanics during washing dishes recommending standing with feet in staggered position or placing one foot at edge of cabinet to shift weight      Exercises   Exercises Lumbar;Neck      Neck Exercises: Supine   Neck Retraction 10 reps    Shoulder ABduction 10 reps    Shoulder Abduction Limitations red theraband      Lumbar Exercises: Stretches   Active Hamstring Stretch Right;Left;1 rep;30  seconds    Prone on Elbows Stretch 1 rep;30 seconds    Quad Stretch Right;Left;1 rep;60 seconds    Quad Stretch Limitations with PROM hip IR/ER    Piriformis Stretch Right;Left;1 rep;30 seconds    Other Lumbar Stretch Exercise QL stretch R side in L sidelying x 1 rep with passive overpressure      Lumbar Exercises: Aerobic   Nustep L5:  4 minutes UEs/LEs      Lumbar Exercises: Standing   Functional Squats 10 reps    Functional Squats Limitations squat with chair bump    Other Standing Lumbar Exercises Step ups x 10 reps "up/up, down/down"     Other Standing Lumbar Exercises Rolling a ball up/down the wall with core engaged for postural lengthening      Lumbar Exercises: Seated   Hip Flexion on Ball 10 reps    Hip Flexion on Ball Limitations challenging with shoes on (no tread)    Other Seated Lumbar Exercises Physioball sitting with alternating UE flexion      Lumbar Exercises: Supine   Bridge 10 reps    Bridge Limitations cues for pelvic tilt before lifting    Other Supine Lumbar Exercises hip hiking  and depression x 10 reps (ball under ankle) R and L sides      Lumbar Exercises: Sidelying   Hip Abduction 10 reps;3 seconds;Right;Left      Lumbar Exercises: Quadruped   Madcat/Old Horse 5 reps   elicits pain in R SI joint   Madcat/Old Horse Limitations limited mobility in low back      Manual Therapy   Manual Therapy Soft tissue mobilization    Soft tissue mobilization STM R QL in L sidelying to relax musculature; R glut med tennis ball roll against wall                  PT Education - 02/29/20 1220    Education Details progression of HEP to add strengthening (bridges and hip abduction)    Person(s) Educated Patient    Methods Explanation;Demonstration;Handout    Comprehension Verbalized understanding;Returned demonstration               PT Long Term Goals - 02/22/20 1301      PT LONG TERM GOAL #1   Title Increase spinal mobilty and trunk ROM with patient  demonstrating WFL's and minimal to no pain with lumbar motions    Time 6    Period Weeks    Status New    Target Date 04/04/20      PT LONG TERM GOAL #2   Title Increase LE strength 1/2 to 1 muscle grade    Time 6    Period Weeks    Status New    Target Date 04/04/20      PT LONG TERM GOAL #3   Title Decrease frequency, intensity, or duration of pain by 25-50% allowing patient to participate in exercise program for at leat 20 min on a daily basis with minimal to no increase in pain    Time 6    Period Weeks    Status New    Target Date 04/04/20      PT LONG TERM GOAL #4   Title Patient to demonstrate increased cervical ROM by 3-7 degrees in lateral flexion and rotation with no change in pain    Time 6    Period Weeks    Status New    Target Date 04/04/20      PT LONG TERM GOAL #5   Title Improve posture and alignment with patient to demonstrate improved upright posture with posterior shoulder girdle musculature engaged    Time 6    Period Weeks    Status New    Target Date 04/04/20      Additional Long Term Goals   Additional Long Term Goals Yes      PT LONG TERM GOAL #6   Title Independent in aquatic exercise program to transition to community or private pool    Time 6    Period Weeks    Status New    Target Date 04/04/20      PT LONG TERM GOAL #7   Title Independent in HEP    Time 6    Period Weeks    Status New    Target Date 04/04/20      PT LONG TERM GOAL #8   Title Improve FOTO to </= 35% limitation    Time 6    Period Weeks    Status New    Target Date 04/04/20                 Plan - 02/29/20 1224    Clinical  Impression Statement The patient notes a pattern of no pain with exercise, and pain when in one position for too long (driving or washing dishes).  She improves with movement.  Today's session focused on increasing strength for low back and upper back to improve postural support.  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    Comorbidities chronic pain; obesity; sedentary lifestyle    Rehab Potential Good    PT Frequency 2x / week    PT Duration 6 weeks    PT Treatment/Interventions Patient/family education;ADLs/Self Care Home Management;Cryotherapy;Electrical Stimulation;Iontophoresis 4mg /ml Dexamethasone;Moist Heat;Traction;Gait training;Functional mobility training;Therapeutic activities;Therapeutic exercise;Balance training;Neuromuscular re-education;Manual techniques;Dry needling;Taping    PT Next Visit Plan TENS instruction; core strengthening.  Progress core and LE strengthening, upper back strengthening and postural support    PT Home Exercise Plan [email or web address]    Consulted and Agree with Plan of Care Patient           Patient will benefit from skilled therapeutic intervention in order to improve the following deficits and impairments:  Decreased range of motion, Increased fascial restricitons, Obesity, Decreased activity tolerance, Pain, Hypomobility, Impaired flexibility, Improper body mechanics, Decreased mobility, Decreased strength, Postural dysfunction  Visit Diagnosis: Chronic bilateral low back pain without sciatica  Cervicalgia  Other symptoms and signs involving the musculoskeletal system  Abnormal posture  Muscle weakness (generalized)     Problem List Patient Active Problem List   Diagnosis Date Noted  . Family history of colon cancer in father 02/10/2020  . H/O: hysterectomy 02/10/2020  . Former smoker 02/10/2020  . Chronic low back pain 02/10/2020  . Disc disease, degenerative, cervical 02/10/2020  . Mild intermittent asthma without complication 02/10/2020  . Mild obstructive sleep apnea 10/28/2018  . Anxiety and depression 09/14/2018  . Dyspareunia, female 05/30/2017  . Subcutaneous nodule of breast 06/20/2016  . Hypothyroidism 09/12/2011    Tacari Repass, PT 02/29/2020, 12:26 PM  [facility] [address] [place], [place], [place] Phone: [phone]   Fax:  [phone]  Name: Alexandra Greer MRN: Trena Platt Date of Birth: [birth year]

## 2020-03-02 ENCOUNTER — Other Ambulatory Visit: Payer: Self-pay

## 2020-03-02 ENCOUNTER — Ambulatory Visit (INDEPENDENT_AMBULATORY_CARE_PROVIDER_SITE_OTHER): Payer: BC Managed Care – PPO | Admitting: Physical Therapy

## 2020-03-02 DIAGNOSIS — R29898 Other symptoms and signs involving the musculoskeletal system: Secondary | ICD-10-CM | POA: Diagnosis not present

## 2020-03-02 DIAGNOSIS — R293 Abnormal posture: Secondary | ICD-10-CM

## 2020-03-02 DIAGNOSIS — G8929 Other chronic pain: Secondary | ICD-10-CM

## 2020-03-02 DIAGNOSIS — M542 Cervicalgia: Secondary | ICD-10-CM | POA: Diagnosis not present

## 2020-03-02 DIAGNOSIS — M545 Low back pain: Secondary | ICD-10-CM

## 2020-03-02 DIAGNOSIS — M6281 Muscle weakness (generalized): Secondary | ICD-10-CM

## 2020-03-02 NOTE — Therapy (Signed)
[facility] [place] [address] [place], [place], [address] Phone: [phone]   Fax:  [phone]  Physical Therapy Treatment  Patient Details  Name: Alexandra Greer MRN: [id number] Date of Birth: 1971-04-24 Referring Provider (PT): Dr Sunnie Nielsen    Encounter Date: 03/02/2020   PT End of Session - 03/02/20 0943    Visit Number 4    Number of Visits 12    Date for PT Re-Evaluation 04/04/20    PT Start Time 0936    PT Stop Time 1015    PT Time Calculation (min) 39 min    Activity Tolerance Patient tolerated treatment well    Behavior During Therapy [facility] for tasks assessed/performed           Past Medical History:  Diagnosis Date  . Anxiety   . Asthma   . Colon polyps   . Depression   . Low thyroid stimulating hormone (TSH) level     No past surgical history on file.  There were no vitals filed for this visit.   Subjective Assessment - 03/02/20 0940    Subjective Pt reports she did a lot of walking the day of last session. She states she had a flare, "but it wasn't too bad".  She hasn't had time to do her exercises, but has been focusing on body mechanics with elder care.    Currently in Pain? No/denies    Pain Score 0-No pain   tightness, but not pain.             [facility] PT Assessment - 03/02/20 0001      Assessment   Medical Diagnosis Cervicalgia; LBP     Referring Provider (PT) Dr Sunnie Nielsen     Onset Date/Surgical Date 02/01/20   neck pain since 2012; LBP x 2 yrs    Hand Dominance Right    Next MD Visit 03/14/20    Prior Therapy for neck ~ 1 yr ago             [facility] Adult PT Treatment/Exercise - 03/02/20 0001      Self-Care   Other Self-Care Comments  discussed body mechanics for elder care, including guarding techniques of client in standing, for gait, and for transfers; pt returned demo with cues.   Pt educated on parameters, application and safety of TENS; pt verbalized understanding.        Lumbar Exercises: Stretches   Piriformis Stretch Right;Left;1 rep;20 seconds    Other Lumbar Stretch Exercise QL stretch R side, in sitting with Rt arm overhead x 2 rep x 30 sec       Lumbar Exercises: Aerobic   Nustep L5: 7 min (arms/legs)      Lumbar Exercises: Standing   Other Standing Lumbar Exercises Rolling a ball up/down the wall with core engaged for postural lengthening      Lumbar Exercises: Seated   Hip Flexion on Ball 10 reps;Strengthening   ab set, sitting on dynadisc   Sit to Stand 10 reps   with ab set   Other Seated Lumbar Exercises seated, slight post lean with ab set and alternating arms over head x 10 reps each      Lumbar Exercises: Sidelying   Hip Abduction 10 reps;3 seconds;Right;Left                  PT Education - 03/02/20 1228    Education Details TENS info, body mechanics.    Person(s) Educated Patient  Methods Explanation;Demonstration    Comprehension Verbalized understanding;Returned demonstration               PT Long Term Goals - 02/22/20 1301      PT LONG TERM GOAL #1   Title Increase spinal mobilty and trunk ROM with patient demonstrating WFL's and minimal to no pain with lumbar motions    Time 6    Period Weeks    Status New    Target Date 04/04/20      PT LONG TERM GOAL #2   Title Increase LE strength 1/2 to 1 muscle grade    Time 6    Period Weeks    Status New    Target Date 04/04/20      PT LONG TERM GOAL #3   Title Decrease frequency, intensity, or duration of pain by 25-50% allowing patient to participate in exercise program for at leat 20 min on a daily basis with minimal to no increase in pain    Time 6    Period Weeks    Status New    Target Date 04/04/20      PT LONG TERM GOAL #4   Title Patient to demonstrate increased cervical ROM by 3-7 degrees in lateral flexion and rotation with no change in pain    Time 6    Period Weeks    Status New    Target Date 04/04/20      PT LONG TERM GOAL #5    Title Improve posture and alignment with patient to demonstrate improved upright posture with posterior shoulder girdle musculature engaged    Time 6    Period Weeks    Status New    Target Date 04/04/20      Additional Long Term Goals   Additional Long Term Goals Yes      PT LONG TERM GOAL #6   Title Independent in aquatic exercise program to transition to community or private pool    Time 6    Period Weeks    Status New    Target Date 04/04/20      PT LONG TERM GOAL #7   Title Independent in HEP    Time 6    Period Weeks    Status New    Target Date 04/04/20      PT LONG TERM GOAL #8   Title Improve FOTO to </= 35% limitation    Time 6    Period Weeks    Status New    Target Date 04/04/20                 Plan - 03/02/20 1229    Clinical Impression Statement Pt tolerated today's exercises well, without production of pain.  Time spent educating pt on body mechanics/posture and TENS application.  Gradual improvement reported; progressing towards goals.    Comorbidities chronic pain; obesity; sedentary lifestyle    Rehab Potential Good    PT Frequency 2x / week    PT Duration 6 weeks    PT Treatment/Interventions Patient/family education;ADLs/Self Care Home Management;Cryotherapy;Electrical Stimulation;Iontophoresis 4mg /ml Dexamethasone;Moist Heat;Traction;Gait training;Functional mobility training;Therapeutic activities;Therapeutic exercise;Balance training;Neuromuscular re-education;Manual techniques;Dry needling;Taping    PT Next Visit Plan MMT/ROM for goal assessment.  Progress core and LE strengthening, upper back strengthening and postural support    PT Home Exercise Plan [email or web address]    Consulted and Agree with Plan of Care Patient           Patient will benefit from skilled therapeutic intervention in  order to improve the following deficits and impairments:  Decreased range of motion, Increased fascial restricitons, Obesity, Decreased activity tolerance, Pain,  Hypomobility, Impaired flexibility, Improper body mechanics, Decreased mobility, Decreased strength, Postural dysfunction  Visit Diagnosis: Chronic bilateral low back pain without sciatica  Cervicalgia  Other symptoms and signs involving the musculoskeletal system  Abnormal posture  Muscle weakness (generalized)     Problem List Patient Active Problem List   Diagnosis Date Noted  . Family history of colon cancer in father 02/10/2020  . H/O: hysterectomy 02/10/2020  . Former smoker 02/10/2020  . Chronic low back pain 02/10/2020  . Disc disease, degenerative, cervical 02/10/2020  . Mild intermittent asthma without complication 02/10/2020  . Mild obstructive sleep apnea 10/28/2018  . Anxiety and depression 09/14/2018  . Dyspareunia, female 05/30/2017  . Subcutaneous nodule of breast 06/20/2016  . Hypothyroidism 09/12/2011   Mayer Camel, PTA 03/02/20 1:08 PM  [facility] [place] [address] [place], [place], [address] Phone: [phone]   Fax:  [phone]  Name: Alexandra Greer MRN: [id number] Date of Birth: 03-31-1971

## 2020-03-07 ENCOUNTER — Encounter: Payer: BC Managed Care – PPO | Admitting: Physical Therapy

## 2020-03-09 ENCOUNTER — Ambulatory Visit (INDEPENDENT_AMBULATORY_CARE_PROVIDER_SITE_OTHER): Payer: BC Managed Care – PPO | Admitting: Physical Therapy

## 2020-03-09 ENCOUNTER — Other Ambulatory Visit: Payer: Self-pay

## 2020-03-09 DIAGNOSIS — E669 Obesity, unspecified: Secondary | ICD-10-CM | POA: Diagnosis not present

## 2020-03-09 DIAGNOSIS — R293 Abnormal posture: Secondary | ICD-10-CM | POA: Diagnosis not present

## 2020-03-09 DIAGNOSIS — M6281 Muscle weakness (generalized): Secondary | ICD-10-CM | POA: Diagnosis not present

## 2020-03-09 DIAGNOSIS — Z6839 Body mass index (BMI) 39.0-39.9, adult: Secondary | ICD-10-CM | POA: Diagnosis not present

## 2020-03-09 DIAGNOSIS — G8929 Other chronic pain: Secondary | ICD-10-CM

## 2020-03-09 DIAGNOSIS — R29898 Other symptoms and signs involving the musculoskeletal system: Secondary | ICD-10-CM

## 2020-03-09 DIAGNOSIS — M542 Cervicalgia: Secondary | ICD-10-CM

## 2020-03-09 DIAGNOSIS — M545 Low back pain: Secondary | ICD-10-CM | POA: Diagnosis not present

## 2020-03-09 NOTE — Therapy (Addendum)
[place] [address] [place], [place], [address] Phone: [phone]   Fax:  [phone]  Physical Therapy Treatment  Patient Details  Name: Alexandra Greer MRN: [id number] Date of Birth: 07-11-1971 Referring Provider (PT): Dr Emeterio Reeve    Encounter Date: 03/09/2020   PT End of Session - 03/09/20 1019    Visit Number 5    Number of Visits 12    Date for PT Re-Evaluation 04/04/20    PT Start Time 0930    PT Stop Time 1021    PT Time Calculation (min) 51 min    Activity Tolerance Patient tolerated treatment well;No increased pain    Behavior During Therapy WFL for tasks assessed/performed           Past Medical History:  Diagnosis Date  . Anxiety   . Asthma   . Colon polyps   . Depression   . Low thyroid stimulating hormone (TSH) level     No past surgical history on file.  There were no vitals filed for this visit.   Subjective Assessment - 03/09/20 0935    Subjective Pt reports her neck is bothering her this morning. She was putting away dishes and her back/neck began to bother her.  Stress of work and moving is also increasing her neck pain. She has been utilizing the tips to body mechanics with housework at job.    Patient Stated Goals find some relief for LBP and help manage neck pain    Currently in Pain? Yes    Pain Score 4     Pain Location Neck    Pain Orientation Right;Upper    Pain Descriptors / Indicators Dull    Aggravating Factors  sleep position    Pain Relieving Factors medicine, massage, ice              [facility] PT Assessment - 03/09/20 0001      Assessment   Medical Diagnosis Cervicalgia; LBP     Referring Provider (PT) Dr Emeterio Reeve     Onset Date/Surgical Date 02/01/20   neck pain since 2012; LBP x 2 yrs    Hand Dominance Right    Next MD Visit 03/14/20    Prior Therapy for neck ~ 1 yr ago             [facility] Adult PT Treatment/Exercise - 03/09/20 0001       Self-Care   Other Self-Care Comments  pt educated on self subocccipital release with massage blocks- trialed x 4 min      Neck Exercises: Seated   Other Seated Exercise leaning forward with forearms on thighs:  cervical flexion to neutral x 5, then cervical diagonals x 5 each way.       Neck Exercises: Supine   Neck Retraction 5 reps;5 secs    Other Supine Exercise snow angels x 5 reps    Other Supine Exercise hooklying: D2 flexion Rt/Lt x 10, horiz abdct x 10, bilat ER x 10 - all with red band.       Lumbar Exercises: Stretches   Passive Hamstring Stretch Right;Left;2 reps;30 seconds   supine with strap     Lumbar Exercises: Aerobic   Nustep L5: 5.5 min (arms/legs)      Shoulder Exercises: Stretch   Other Shoulder Stretches midlevel doorway x 20 sec x 2 reps; high level unilateral doorway stretch x 20 sec x 2 reps each side.       Moist Heat Therapy  Number Minutes Moist Heat 10 Minutes    Moist Heat Location Lumbar Spine      Cryotherapy   Number Minutes Cryotherapy 10 Minutes    Cryotherapy Location Cervical    Type of Cryotherapy Ice pack      Electrical Stimulation   Electrical Stimulation Location Rt upper trap, levator, and cervical paraspinals    Electrical Stimulation Action IFC    Electrical Stimulation Parameters intensity to tolerance x 10 min     Electrical Stimulation Goals Pain;Tone                  PT Education - 03/09/20 1022    Education Details HEP, and red band issued.    Person(s) Educated Patient    Methods Explanation;Demonstration;Verbal cues;Handout    Comprehension Returned demonstration;Verbalized understanding               PT Long Term Goals - 02/22/20 1301      PT LONG TERM GOAL #1   Title Increase spinal mobilty and trunk ROM with patient demonstrating WFL's and minimal to no pain with lumbar motions    Time 6    Period Weeks    Status New    Target Date 04/04/20      PT LONG TERM GOAL #2   Title Increase LE strength  1/2 to 1 muscle grade    Time 6    Period Weeks    Status New    Target Date 04/04/20      PT LONG TERM GOAL #3   Title Decrease frequency, intensity, or duration of pain by 25-50% allowing patient to participate in exercise program for at leat 20 min on a daily basis with minimal to no increase in pain    Time 6    Period Weeks    Status New    Target Date 04/04/20      PT LONG TERM GOAL #4   Title Patient to demonstrate increased cervical ROM by 3-7 degrees in lateral flexion and rotation with no change in pain    Time 6    Period Weeks    Status New    Target Date 04/04/20      PT LONG TERM GOAL #5   Title Improve posture and alignment with patient to demonstrate improved upright posture with posterior shoulder girdle musculature engaged    Time 6    Period Weeks    Status New    Target Date 04/04/20      Additional Long Term Goals   Additional Long Term Goals Yes      PT LONG TERM GOAL #6   Title Independent in aquatic exercise program to transition to community or private pool    Time 6    Period Weeks    Status New    Target Date 04/04/20      PT LONG TERM GOAL #7   Title Independent in HEP    Time 6    Period Weeks    Status New    Target Date 04/04/20      PT LONG TERM GOAL #8   Title Improve FOTO to </= 35% limitation    Time 6    Period Weeks    Status New    Target Date 04/04/20                 Plan - 03/09/20 1016    Clinical Impression Statement Pt reported increased pain during session with Rt cervical rotation during  session.  Pain lessened after suboccipital release with massage blocks and exercises.  Pain further reduced with use of estim at end of session.  Pt gradually progressing towards established goals.  Pt reporting overall improvement of pain with use of improved body mechanics at work/home.    Comorbidities chronic pain; obesity; sedentary lifestyle    Rehab Potential Good    PT Frequency 2x / week    PT Duration 6 weeks     PT Treatment/Interventions Patient/family education;ADLs/Self Care Home Management;Cryotherapy;Electrical Stimulation;Iontophoresis 60m/ml Dexamethasone;Moist Heat;Traction;Gait training;Functional mobility training;Therapeutic activities;Therapeutic exercise;Balance training;Neuromuscular re-education;Manual techniques;Dry needling;Taping    PT Next Visit Plan MMT/ROM for goal assessment.  Progress core and LE strengthening, upper back strengthening and postural support    PT Home Exercise Plan [id number]    Consulted and Agree with Plan of Care Patient           Patient will benefit from skilled therapeutic intervention in order to improve the following deficits and impairments:  Decreased range of motion, Increased fascial restricitons, Obesity, Decreased activity tolerance, Pain, Hypomobility, Impaired flexibility, Improper body mechanics, Decreased mobility, Decreased strength, Postural dysfunction  Visit Diagnosis: No diagnosis found.     Problem List Patient Active Problem List   Diagnosis Date Noted  . Family history of colon cancer in father 02/10/2020  . H/O: hysterectomy 02/10/2020  . Former smoker 02/10/2020  . Chronic low back pain 02/10/2020  . Disc disease, degenerative, cervical 02/10/2020  . Mild intermittent asthma without complication 070/34/0352 . Mild obstructive sleep apnea 10/28/2018  . Anxiety and depression 09/14/2018  . Dyspareunia, female 05/30/2017  . Subcutaneous nodule of breast 06/20/2016  . Hypothyroidism 09/12/2011   JKerin Perna PTA 03/09/20 10:31 AM  [address] [place] [address]Phone: [phone]  Fax:  [phone] Name: Alexandra GawlikMRN: [id number] of Birth: [birth year] PHYSICAL THERAPY DISCHARGE SUMMARY  Visits from Start of Care: 5  Current functional level related to goals / functional outcomes: See progress note for discharge  status   Remaining deficits: Unknown    Education / Equipment: HEP   Plan: Patient agrees to discharge.  Patient goals were partially met. Patient is being discharged due to not returning since the last visit.  ?????    Celyn P. HHelene KelpPT, MPH 05/03/20 9:22 AM

## 2020-03-09 NOTE — Patient Instructions (Signed)
Over Head Pull: Narrow Grip     [place] [phone]   On back, knees bent, feet flat, band across thighs, elbows straight but relaxed. Pull hands apart (start). Keeping elbows straight, bring arms up and over head, hands toward floor. Keep pull steady on band. Hold momentarily. Return slowly, keeping pull steady, back to start. Repeat __10_ times. Band color __red ____   Side Pull: Double Arm   On back, knees bent, feet flat. Arms perpendicular to body, shoulder level, elbows straight but relaxed. Pull arms out to sides, elbows straight. Resistance band comes across collarbones, hands toward floor. Hold momentarily. Slowly return to starting position. Repeat _10__ times. Band color __red__   Sash   On back, knees bent, feet flat, left hand on left hip, right hand above left. Pull right arm DIAGONALLY (hip to shoulder) across chest. Bring right arm along head toward floor. Hold momentarily. Slowly return to starting position. Repeat _10__ times. Do with left arm. Band color __red____   Shoulder Rotation: Double Arm   On back, knees bent, feet flat, elbows tucked at sides, bent 90, hands palms up. Pull hands apart and down toward floor, keeping elbows near sides. Hold momentarily. Slowly return to starting position. Repeat _10__ times. Band color ___red___    

## 2020-03-13 DIAGNOSIS — R300 Dysuria: Secondary | ICD-10-CM | POA: Diagnosis not present

## 2020-03-13 DIAGNOSIS — N390 Urinary tract infection, site not specified: Secondary | ICD-10-CM | POA: Diagnosis not present

## 2020-03-14 ENCOUNTER — Encounter: Payer: Self-pay | Admitting: Osteopathic Medicine

## 2020-03-14 ENCOUNTER — Ambulatory Visit (INDEPENDENT_AMBULATORY_CARE_PROVIDER_SITE_OTHER): Payer: BC Managed Care – PPO | Admitting: Nurse Practitioner

## 2020-03-14 ENCOUNTER — Other Ambulatory Visit: Payer: Self-pay

## 2020-03-14 VITALS — BP 115/82 | HR 70 | Temp 97.0°F | Wt 218.0 lb

## 2020-03-14 DIAGNOSIS — J45909 Unspecified asthma, uncomplicated: Secondary | ICD-10-CM | POA: Diagnosis not present

## 2020-03-14 MED ORDER — AMBULATORY NON FORMULARY MEDICATION: 0 refills | Status: DC

## 2020-03-14 MED ORDER — BUDESONIDE 0.5 MG/2ML IN SUSP: 0.5000 mg | Freq: Two times a day (BID) | RESPIRATORY_TRACT | 12 refills | Status: DC | PRN

## 2020-03-14 NOTE — Patient Instructions (Addendum)
I have ordered an inhaled steroid nebulized solution to be used when you have an asthma exacerbation or for periods when you feel your asthma may worsen. You will use this 2 times a day when you are having an exacerbation. If your symptoms worsen, even after use, please let us know.   I have ordered a nebulizer machine. You can take the prescription to the pharmacy or to a medical supply store (there is a [facility] in [place]) to have this filled.    Asthma, Adult  Asthma is a long-term (chronic) condition in which the airways get tight and narrow. The airways are the breathing passages that lead from the nose and mouth down into the lungs. A person with asthma will have times when symptoms get worse. These are called asthma attacks. They can cause coughing, whistling sounds when you breathe (wheezing), shortness of breath, and chest pain. They can make it hard to breathe. There is no cure for asthma, but medicines and lifestyle changes can help control it. There are many things that can bring on an asthma attack or make asthma symptoms worse (triggers). Common triggers include:  Mold.  Dust.  Cigarette smoke.  Cockroaches.  Things that can cause allergy symptoms (allergens). These include animal skin flakes (dander) and pollen from trees or grass.  Things that pollute the air. These may include household cleaners, wood smoke, smog, or chemical odors.  Cold air, weather changes, and wind.  Crying or laughing hard.  Stress.  Certain medicines or drugs.  Certain foods such as dried fruit, potato chips, and grape juice.  Infections, such as a cold or the flu.  Certain medical conditions or diseases.  Exercise or tiring activities. Asthma may be treated with medicines and by staying away from the things that cause asthma attacks. Types of medicines may include:  Controller medicines. These help prevent asthma symptoms. They are usually taken every day.  Fast-acting  reliever or rescue medicines. These quickly relieve asthma symptoms. They are used as needed and provide short-term relief.  Allergy medicines if your attacks are brought on by allergens.  Medicines to help control the body's defense (immune) system. Follow these instructions at home: Avoiding triggers in your home  Change your heating and air conditioning filter often.  Limit your use of fireplaces and wood stoves.  Get rid of pests (such as roaches and mice) and their droppings.  Throw away plants if you see mold on them.  Clean your floors. Dust regularly. Use cleaning products that do not smell.  Have someone vacuum when you are not home. Use a vacuum cleaner with a HEPA filter if possible.  Replace carpet with wood, tile, or vinyl flooring. Carpet can trap animal skin flakes and dust.  Use allergy-proof pillows, mattress covers, and box spring covers.  Wash bed sheets and blankets every week in hot water. Dry them in a dryer.  Keep your bedroom free of any triggers.  Avoid pets and keep windows closed when things that cause allergy symptoms are in the air.  Use blankets that are made of polyester or cotton.  Clean bathrooms and kitchens with bleach. If possible, have someone repaint the walls in these rooms with mold-resistant paint. Keep out of the rooms that are being cleaned and painted.  Wash your hands often with soap and water. If soap and water are not available, use hand sanitizer.  Do not allow anyone to smoke in your home. General instructions  Take over-the-counter and prescription  medicines only as told by your doctor. ? Talk with your doctor if you have questions about how or when to take your medicines. ? Make note if you need to use your medicines more often than usual.  Do not use any products that contain nicotine or tobacco, such as cigarettes and e-cigarettes. If you need help quitting, ask your doctor.  Stay away from secondhand smoke.  Avoid  doing things outdoors when allergen counts are high and when air quality is low.  Wear a ski mask when doing outdoor activities in the winter. The mask should cover your nose and mouth. Exercise indoors on cold days if you can.  Warm up before you exercise. Take time to cool down after exercise.  Use a peak flow meter as told by your doctor. A peak flow meter is a tool that measures how well the lungs are working.  Keep track of the peak flow meter's readings. Write them down.  Follow your asthma action plan. This is a written plan for taking care of your asthma and treating your attacks.  Make sure you get all the shots (vaccines) that your doctor recommends. Ask your doctor about a flu shot and a pneumonia shot.  Keep all follow-up visits as told by your doctor. This is important. Contact a doctor if:  You have wheezing, shortness of breath, or a cough even while taking medicine to prevent attacks.  The mucus you cough up (sputum) is thicker than usual.  The mucus you cough up changes from clear or white to yellow, green, gray, or bloody.  You have problems from the medicine you are taking, such as: ? A rash. ? Itching. ? Swelling. ? Trouble breathing.  You need reliever medicines more than 2-3 times a week.  Your peak flow reading is still at 50-79% of your personal best after following the action plan for 1 hour.  You have a fever. Get help right away if:  You seem to be worse and are not responding to medicine during an asthma attack.  You are short of breath even at rest.  You get short of breath when doing very little activity.  You have trouble eating, drinking, or talking.  You have chest pain or tightness.  You have a fast heartbeat.  Your lips or fingernails start to turn blue.  You are light-headed or dizzy, or you faint.  Your peak flow is less than 50% of your personal best.  You feel too tired to breathe normally. Summary  Asthma is a long-term  (chronic) condition in which the airways get tight and narrow. An asthma attack can make it hard to breathe.  Asthma cannot be cured, but medicines and lifestyle changes can help control it.  Make sure you understand how to avoid triggers and how and when to use your medicines. This information is not intended to replace advice given to you by your health care provider. Make sure you discuss any questions you have with your health care provider. Document Revised: 10/29/2018 Document Reviewed: 09/30/2016 Elsevier Patient Education  2020 [employer].

## 2020-03-14 NOTE — Progress Notes (Signed)
Established Patient Office Visit  Subjective:  Patient ID: Alexandra Greer, female    DOB: 1971/06/26  Age: 49 y.o. MRN: [id number]  CC: No chief complaint on file.   HPI Alexandra Greer presents for follow-up for acute bronchitis in the setting of asthma. She reports her symptoms have significantly improved. She used the albuterol inhaler about twice a day for one week after the last visit and has not had to use it since that time. She was unable to get the Symbicort inhaler due to cost and limited insurance coverage. She is interested in a nebulizer machine if insurance will cover this for future exacerbations.   Denies fever, chills, sore throat, shortness of breath, wheezing, dizziness, or exacerbations of asthma in the last 2 weeks.   Past Medical History:  Diagnosis Date  . Anxiety   . Asthma   . Colon polyps   . Depression   . Low thyroid stimulating hormone (TSH) level     No past surgical history on file.  Family History  Problem Relation Age of Onset  . Hypertension Mother   . Diabetes Mother   . Hypertension Father     Social History   Socioeconomic History  . Marital status: Married    Spouse name: Not on file  . Number of children: Not on file  . Years of education: Not on file  . Highest education level: Not on file  Occupational History  . Not on file  Tobacco Use  . Smoking status: Former Research scientist (life sciences)  . Smokeless tobacco: Never Used  Vaping Use  . Vaping Use: Never used  Substance and Sexual Activity  . Alcohol use: Never  . Drug use: Never  . Sexual activity: Yes    Partners: Male  Other Topics Concern  . Not on file  Social History Narrative  . Not on file   Social Determinants of Health   Financial Resource Strain:   . Difficulty of Paying Living Expenses:   Food Insecurity:   . Worried About Charity fundraiser in the Last Year:   . Arboriculturist in the Last Year:   Transportation Needs:   . Film/video editor (Medical):   [name] Lack  of Transportation (Non-Medical):   Physical Activity:   . Days of Exercise per Week:   . Minutes of Exercise per Session:   Stress:   . Feeling of Stress :   Social Connections:   . Frequency of Communication with Friends and Family:   . Frequency of Social Gatherings with Friends and Family:   . Attends Religious Services:   . Active Member of Clubs or Organizations:   . Attends Archivist Meetings:   [name] Marital Status:   Intimate Partner Violence:   . Fear of Current or Ex-Partner:   . Emotionally Abused:   [name] Physically Abused:   . Sexually Abused:     Outpatient Medications Prior to Visit  Medication Sig Dispense Refill  . acetaminophen (TYLENOL) 500 MG tablet Take 500 mg by mouth every 6 (six) hours as needed.    [name] albuterol (VENTOLIN HFA) 108 (90 Base) MCG/ACT inhaler Inhale 1-2 puffs into the lungs every 6 (six) hours as needed for wheezing or shortness of breath. 18 g 99  . estradiol (VIVELLE-DOT) 0.05 MG/24HR patch Place 1 patch onto the skin once a week.    . Fish Oil-Cholecalciferol (FISH OIL + D3) 1000-1000 MG-UNIT CAPS Take by mouth.    [name] ibuprofen (ADVIL) 200 MG  tablet Take 200 mg by mouth every 6 (six) hours as needed.    [name] levothyroxine (SYNTHROID) 100 MCG tablet Take 1 tablet (100 mcg total) by mouth daily before breakfast. 90 tablet 1  . budesonide-formoterol (SYMBICORT) 160-4.5 MCG/ACT inhaler Inhale 2 puffs into the lungs 2 (two) times daily. (Patient not taking: Reported on 03/14/2020) 1 Inhaler 3   No facility-administered medications prior to visit.    Allergies  Allergen Reactions  . Nickel Itching, Rash and Hives    ROS  Objective:    Physical Exam Constitutional:      Appearance: Normal appearance.  HENT:     Head: Normocephalic.  Eyes:     Extraocular Movements: Extraocular movements intact.     Conjunctiva/sclera: Conjunctivae normal.     Pupils: Pupils are equal, round, and reactive to light.  Neck:     Vascular: No carotid bruit.   Cardiovascular:     Rate and Rhythm: Normal rate and regular rhythm.     Pulses: Normal pulses.     Heart sounds: Normal heart sounds.  Pulmonary:     Effort: Pulmonary effort is normal.     Breath sounds: Normal breath sounds. No wheezing.  Abdominal:     General: Abdomen is flat. Bowel sounds are normal.     Palpations: Abdomen is soft.  Musculoskeletal:        General: Normal range of motion.     Cervical back: Normal range of motion.  Skin:    General: Skin is warm and dry.     Capillary Refill: Capillary refill takes less than 2 seconds.  Neurological:     General: No focal deficit present.     Mental Status: She is alert and oriented to person, place, and time.  Psychiatric:        Mood and Affect: Mood normal.        Behavior: Behavior normal.        Thought Content: Thought content normal.        Judgment: Judgment normal.     BP 115/82 (BP Location: Left Arm, Patient Position: Sitting)   Pulse 70   Temp (!) 97 F (36.1 C)   Wt 218 lb (98.9 kg)   SpO2 99%   BMI 38.62 kg/m  Wt Readings from Last 3 Encounters:  03/14/20 218 lb (98.9 kg)  02/10/20 224 lb (101.6 kg)     Health Maintenance Due  Topic Date Due  . Hepatitis C Screening  Never done  . HIV Screening  Never done  . PAP SMEAR-Modifier  Never done  . MAMMOGRAM  Never done    There are no preventive care reminders to display for this patient.  No results found for: TSH No results found for: WBC, HGB, HCT, MCV, PLT No results found for: NA, K, CHLORIDE, CO2, GLUCOSE, BUN, CREATININE, BILITOT, ALKPHOS, AST, ALT, PROT, ALBUMIN, CALCIUM, ANIONGAP, EGFR, GFR No results found for: CHOL No results found for: HDL No results found for: LDLCALC No results found for: TRIG No results found for: CHOLHDL No results found for: HGBA1C    Assessment & Plan:   1. Uncomplicated asthma, unspecified asthma severity, unspecified whether persistent Acute bronchitis much improved today. She is no longer needing  daily medication for her asthma symptoms. She would benefit from having nebulizer solution of inhaled corticosteroid available for when she experiences exacerbations to avoid increase in symptoms and progression of illness. Will provide prescription for machine and budesonide today to have available. Discussed instructions with  patient and all questions answered.   PLAN: - Utilize albuterol as needed for asthma symptoms - If you begin to experience asthma exacerbation, begin nebulizer with budesonide twice a day- if symptoms worsen or do not improve, let us know.  - AMBULATORY NON FORMULARY MEDICATION; 1 nebulizer machine- please include mask and tubing for use.  ICD 10: J45.909  Dispense: 1 vial; Refill: 0 - budesonide (PULMICORT) 0.5 MG/2ML nebulizer solution; Take 2 mLs (0.5 mg total) by nebulization 2 (two) times daily as needed.  Dispense: 60 mL; Refill: 12  Return if symptoms worsen or fail to improve.  Orma Render, NP

## 2020-03-16 ENCOUNTER — Encounter: Payer: BC Managed Care – PPO | Admitting: Physical Therapy

## 2020-03-22 ENCOUNTER — Emergency Department (INDEPENDENT_AMBULATORY_CARE_PROVIDER_SITE_OTHER)
Admission: EM | Admit: 2020-03-22 | Discharge: 2020-03-22 | Disposition: A | Discharge status: Home / Self Care | Payer: BC Managed Care – PPO | Source: Home / Self Care | Attending: Family Medicine | Admitting: Family Medicine

## 2020-03-22 ENCOUNTER — Other Ambulatory Visit: Payer: Self-pay

## 2020-03-22 DIAGNOSIS — J069 Acute upper respiratory infection, unspecified: Secondary | ICD-10-CM

## 2020-03-22 HISTORY — DX: Hypothyroidism, unspecified: E03.9

## 2020-03-22 MED ORDER — PREDNISONE 20 MG PO TABS
ORAL_TABLET | ORAL | 0 refills | Status: DC
Start: 2020-03-22 — End: 2021-08-07

## 2020-03-22 NOTE — Discharge Instructions (Addendum)
Take plain guaifenesin (1200mg extended release tabs such as Mucinex) twice daily, with plenty of water, for cough and congestion.   Get adequate rest.   May use Afrin nasal spray (or generic oxymetazoline) each morning for about 5 days and then discontinue.  Also recommend using saline nasal spray several times daily and saline nasal irrigation (AYR is a common brand).  Use Flonase nasal spray each morning after using Afrin nasal spray and saline nasal irrigation. Try warm salt water gargles for sore throat.  Stop all antihistamines for now, and other non-prescription cough/cold preparations. May take Delsym Cough Suppressant at bedtime for nighttime cough.   Isolate yourself until COVID-19 test result is available.   If your COVID19 test is positive, then you are infected with the novel coronavirus and could give the virus to others.  Please continue isolation at home for at least 10 days since the start of your symptoms. Once you complete your 10 day quarantine, you may return to normal activities as long as you've not had a fever for over 24 hours (without taking fever reducing medicine) and your symptoms are improving. Please continue good preventive care measures, including:  frequent hand-washing, avoid touching your face, cover coughs/sneezes, stay out of crowds and keep a 6 foot distance from others.  Go to the nearest hospital emergency room if fever/cough/breathlessness are severe or illness seems like a threat to life.  

## 2020-03-22 NOTE — ED Triage Notes (Signed)
6 days ago pt had onset of symptoms; congestion, cough, body aches, nasal drainage and loss of taste and smell. No known exposure to covid, not vaccinated. Both parents have been ill as well. Pt has taken tylenol and benadryl otc.

## 2020-03-23 ENCOUNTER — Ambulatory Visit: Payer: BLUE CROSS/BLUE SHIELD | Admitting: Osteopathic Medicine

## 2020-03-24 LAB — SARS-COV-2 RNA,(COVID-19) QUALITATIVE NAAT: SARS CoV2 RNA: DETECTED — AB

## 2020-03-24 NOTE — ED Provider Notes (Signed)
[facility]    CSN: [id number] Arrival date & time: 03/22/20  1129      History   Chief Complaint Chief Complaint  Patient presents with  . Cough  . Nasal Congestion  . Generalized Body Aches  . Nausea    HPI Alexandra Greer is a 49 y.o. female.   Patient began to feel mildly fatigued 8 days ago, becoming worse during the next two days.  Four days ago she developed chills, myalgias and increasing chest tightness.  Her cough has become worse and yesterday she lost her sense of taste/smell.  She has developed mild shortness of breath with activity but no pleuritic pain.  She has had intermittent headache, and nausea without vomiting.  She has a history of mild asthma    The history is provided by the patient.    Past Medical History:  Diagnosis Date  . Anxiety   . Asthma   . Colon polyps   . Depression   . Hypothyroid   . Low thyroid stimulating hormone (TSH) level     Patient Active Problem List   Diagnosis Date Noted  . Family history of colon cancer in father 02/10/2020  . H/O: hysterectomy 02/10/2020  . Former smoker 02/10/2020  . Chronic low back pain 02/10/2020  . Disc disease, degenerative, cervical 02/10/2020  . Mild intermittent asthma without complication 02/10/2020  . Mild obstructive sleep apnea 10/28/2018  . Anxiety and depression 09/14/2018  . Dyspareunia, female 05/30/2017  . Subcutaneous nodule of breast 06/20/2016  . Hypothyroidism 09/12/2011    Past Surgical History:  Procedure Laterality Date  . ABDOMINAL HYSTERECTOMY    . BREAST SURGERY Left    lumpectomy    OB History   No obstetric history on file.      Home Medications    Prior to Admission medications   Medication Sig Start Date End Date Taking? Authorizing Provider  phentermine 37.5 MG capsule Take 37.5 mg by mouth every morning.   Yes [provider]  acetaminophen (TYLENOL) 500 MG tablet Take 500 mg by mouth every 6 (six) hours as needed.     [provider]  albuterol (VENTOLIN HFA) 108 (90 Base) MCG/ACT inhaler Inhale 1-2 puffs into the lungs every 6 (six) hours as needed for wheezing or shortness of breath. 02/10/20   Sunnie Nielsen, DO  AMBULATORY NON FORMULARY MEDICATION 1 nebulizer machine- please include mask and tubing for use.  ICD 10: J45.909 03/14/20   Early, Sung Amabile, NP  budesonide (PULMICORT) 0.5 MG/2ML nebulizer solution Take 2 mLs (0.5 mg total) by nebulization 2 (two) times daily as needed. 03/14/20   Tollie Eth, NP  budesonide-formoterol (SYMBICORT) 160-4.5 MCG/ACT inhaler Inhale 2 puffs into the lungs 2 (two) times daily. Patient not taking: Reported on 03/14/2020 02/10/20   Sunnie Nielsen, DO  estradiol (VIVELLE-DOT) 0.05 MG/24HR patch Place 1 patch onto the skin once a week. 01/27/20   [provider]  ibuprofen (ADVIL) 200 MG tablet Take 200 mg by mouth every 6 (six) hours as needed.    [provider]  levothyroxine (SYNTHROID) 100 MCG tablet Take 1 tablet (100 mcg total) by mouth daily before breakfast. 02/10/20   Sunnie Nielsen, DO  predniSONE (DELTASONE) 20 MG tablet Take one tab by mouth twice daily for 4 days, then one daily. Take with food. 03/22/20   Lattie Haw, MD    Family History Family History  Problem Relation Age of Onset  . Hypertension Mother   .  Diabetes Mother   . Hypertension Father     Social History Social History   Tobacco Use  . Smoking status: Current Some Day Smoker    Packs/day: 0.30    Years: 25.00    Pack years: 7.50  . Smokeless tobacco: Never Used  Vaping Use  . Vaping Use: Never used  Substance Use Topics  . Alcohol use: Never  . Drug use: Never     Allergies   Nickel and Topiramate   Review of Systems Review of Systems No sore throat + cough No pleuritic pain No wheezing + nasal congestion + post-nasal drainage No sinus pain/pressure No itchy/red eyes No earache No hemoptysis + SOB No fever, + chills + nausea No  vomiting No abdominal pain No diarrhea No urinary symptoms No skin rash + fatigue + myalgias + headache Used OTC meds (Tylenol and Benadryl) without relief   Physical Exam Triage Vital Signs ED Triage Vitals  Enc Vitals Group     BP 03/22/20 1154 115/81     Pulse Rate 03/22/20 1154 86     Resp 03/22/20 1154 20     Temp 03/22/20 1154 98.5 F (36.9 C)     Temp Source 03/22/20 1154 Oral     SpO2 03/22/20 1154 96 %     Weight 03/22/20 1146 215 lb (97.5 kg)     Height 03/22/20 1146 5\' 3"  (1.6 m)     Head Circumference --      Peak Flow --      Pain Score 03/22/20 1146 0     Pain Loc --      Pain Edu? --      Excl. in GC? --    No data found.  Updated Vital Signs BP 115/81 (BP Location: Left Arm)   Pulse 86   Temp 98.5 F (36.9 C) (Oral)   Resp 20   Ht 5\' 3"  (1.6 m)   Wt 97.5 kg   SpO2 96%   BMI 38.09 kg/m   Visual Acuity Right Eye Distance:   Left Eye Distance:   Bilateral Distance:    Right Eye Near:   Left Eye Near:    Bilateral Near:     Physical Exam Nursing notes and Vital Signs reviewed. Appearance:  Patient appears stated age, and in no acute distress Eyes:  Pupils are equal, round, and reactive to light and accomodation.  Extraocular movement is intact.  Conjunctivae are not inflamed  Ears:  Canals normal.  Tympanic membranes normal.  Nose:  Mildly congested turbinates.  No sinus tenderness.  Pharynx:  Normal Neck:  Supple.  Mildly enlarged lateral nodes are present, tender to palpation on the left.   Lungs:  Clear to auscultation.  Breath sounds are equal.  Moving air well. Heart:  Regular rate and rhythm without murmurs, rubs, or gallops.  Abdomen:  Nontender without masses or hepatosplenomegaly.  Bowel sounds are present.  No CVA or flank tenderness.  Extremities:  No edema.  Skin:  No rash present.   UC Treatments / Results  Labs (all labs ordered are listed, but only abnormal results are displayed) Labs Reviewed  SARS-COV-2 RNA,(COVID-19)  QUALITATIVE NAAT - Abnormal; Notable for the following components:      Result Value   SARS CoV2 RNA DETECTED (*)    All other components within normal limits    EKG   Radiology No results found.  Procedures Procedures (including critical care time)  Medications Ordered in UC Medications - No data  to display  Initial Impression / Assessment and Plan / UC Course  I have reviewed the triage vital signs and the nursing notes.  Pertinent labs & imaging results that were available during my care of the patient were reviewed by me and considered in my medical decision making (see chart for details).    There is no evidence of bacterial infection today.  COVID19 send out test. Followup with Family Doctor if not improved in 7 to 10 days.  Final Clinical Impressions(s) / UC Diagnoses   Final diagnoses:  Viral URI with cough     Discharge Instructions     Take plain guaifenesin (1200mg  extended release tabs such as Mucinex) twice daily, with plenty of water, for cough and congestion.  Get adequate rest.   May use Afrin nasal spray (or generic oxymetazoline) each morning for about 5 days and then discontinue.  Also recommend using saline nasal spray several times daily and saline nasal irrigation (AYR is a common brand).  Use Flonase nasal spray each morning after using Afrin nasal spray and saline nasal irrigation. Try warm salt water gargles for sore throat.  Stop all antihistamines for now, and other non-prescription cough/cold preparations. May take Delsym Cough Suppressant at bedtime for nighttime cough.   Isolate yourself until COVID-19 test result is available.   If your COVID19 test is positive, then you are infected with the novel coronavirus and could give the virus to others.  Please continue isolation at home for at least 10 days since the start of your symptoms.  Once you complete your 10 day quarantine, you may return to normal activities as long as you've not had a  fever for over 24 hours (without taking fever reducing medicine) and your symptoms are improving. Please continue good preventive care measures, including:  frequent hand-washing, avoid touching your face, cover coughs/sneezes, stay out of crowds and keep a 6 foot distance from others.  Go to the nearest hospital emergency room if fever/cough/breathlessness are severe or illness seems like a threat to life.    ED Prescriptions    Medication Sig Dispense Auth. Provider   predniSONE (DELTASONE) 20 MG tablet Take one tab by mouth twice daily for 4 days, then one daily. Take with food. 12 tablet , MD        Lattie Haw, MD 03/24/20 [phone]

## 2020-04-01 ENCOUNTER — Telehealth: Payer: Self-pay | Admitting: Emergency Medicine

## 2020-04-01 NOTE — Telephone Encounter (Signed)
Return call to [name] regarding concerns regarding COVID + results. Pt stated name & DOB to RN. Pt stated she was worried because she had been coughing up some blood yesterday, now resolved. Pt reminded to continue quarantine, rest & hydrate and to go to ER if bleeding starts again. Pt also instructed to check her O2 sat level at home and to go to ER for shortness of breath or O2 sats below 93%. Patient given information on how to obtain a pulse ox for home use. Alexandra Greer verbalized an understanding and thanked me for returning her call

## 2020-04-11 DIAGNOSIS — E669 Obesity, unspecified: Secondary | ICD-10-CM | POA: Diagnosis not present

## 2020-04-11 DIAGNOSIS — Z6837 Body mass index (BMI) 37.0-37.9, adult: Secondary | ICD-10-CM | POA: Diagnosis not present

## 2020-05-11 DIAGNOSIS — Z6837 Body mass index (BMI) 37.0-37.9, adult: Secondary | ICD-10-CM | POA: Diagnosis not present

## 2020-05-11 DIAGNOSIS — E669 Obesity, unspecified: Secondary | ICD-10-CM | POA: Diagnosis not present

## 2020-05-23 DIAGNOSIS — E063 Autoimmune thyroiditis: Secondary | ICD-10-CM | POA: Diagnosis not present

## 2020-05-23 DIAGNOSIS — E039 Hypothyroidism, unspecified: Secondary | ICD-10-CM | POA: Diagnosis not present

## 2020-06-12 DIAGNOSIS — E669 Obesity, unspecified: Secondary | ICD-10-CM | POA: Diagnosis not present

## 2020-06-12 DIAGNOSIS — Z6837 Body mass index (BMI) 37.0-37.9, adult: Secondary | ICD-10-CM | POA: Diagnosis not present

## 2020-07-10 DIAGNOSIS — Z6837 Body mass index (BMI) 37.0-37.9, adult: Secondary | ICD-10-CM | POA: Diagnosis not present

## 2020-07-10 DIAGNOSIS — E669 Obesity, unspecified: Secondary | ICD-10-CM | POA: Diagnosis not present

## 2020-08-09 DIAGNOSIS — E669 Obesity, unspecified: Secondary | ICD-10-CM | POA: Diagnosis not present

## 2020-08-09 DIAGNOSIS — Z6837 Body mass index (BMI) 37.0-37.9, adult: Secondary | ICD-10-CM | POA: Diagnosis not present

## 2020-09-13 DIAGNOSIS — E669 Obesity, unspecified: Secondary | ICD-10-CM | POA: Diagnosis not present

## 2020-11-09 DIAGNOSIS — Z1231 Encounter for screening mammogram for malignant neoplasm of breast: Secondary | ICD-10-CM | POA: Diagnosis not present

## 2020-11-09 LAB — HM MAMMOGRAPHY

## 2020-11-16 ENCOUNTER — Encounter: Payer: Self-pay | Admitting: Osteopathic Medicine

## 2020-12-03 ENCOUNTER — Other Ambulatory Visit: Payer: Self-pay

## 2020-12-03 ENCOUNTER — Emergency Department
Admission: RE | Admit: 2020-12-03 | Discharge: 2020-12-03 | Disposition: A | Discharge status: Home / Self Care | Payer: BC Managed Care – PPO | Source: Ambulatory Visit | Attending: Family Medicine | Admitting: Family Medicine

## 2020-12-03 VITALS — BP 117/79 | HR 67 | Temp 97.9°F | Resp 16 | Ht 63.0 in | Wt 208.0 lb

## 2020-12-03 DIAGNOSIS — J988 Other specified respiratory disorders: Secondary | ICD-10-CM

## 2020-12-03 DIAGNOSIS — B9789 Other viral agents as the cause of diseases classified elsewhere: Secondary | ICD-10-CM

## 2020-12-03 MED ORDER — AZELASTINE-FLUTICASONE 137-50 MCG/ACT NA SUSP
1.0000 | Freq: Two times a day (BID) | NASAL | 0 refills | Status: DC
Start: 2020-12-03 — End: 2021-09-13

## 2020-12-03 MED ORDER — AMOXICILLIN-POT CLAVULANATE 875-125 MG PO TABS
1.0000 | ORAL_TABLET | Freq: Two times a day (BID) | ORAL | 0 refills | Status: DC
Start: 2020-12-03 — End: 2021-08-07

## 2020-12-03 NOTE — ED Provider Notes (Signed)
[facility]    CSN: [id number] Arrival date & time: 12/03/20  1045      History   Chief Complaint Chief Complaint  Patient presents with  . Facial Pain    HPI Alexandra Greer is a 50 y.o. female.   HPI  Patient here for upper respiratory infection.  They have been present for 3 to 4 days.  She is here because she has some pain in the left side of her face and some pressure in her left ear.  She is worried about an ear infection. Patient has not had recent vaccinations No known exposure to COVID, strep, or illness Has not been taking over-the-counter medicines for her symptoms No cough or shortness of breath.  She does have underlying asthma.  She uses albuterol infrequently  Past Medical History:  Diagnosis Date  . Anxiety   . Asthma   . Colon polyps   . Depression   . Hypothyroid   . Low thyroid stimulating hormone (TSH) level     Patient Active Problem List   Diagnosis Date Noted  . Family history of colon cancer in father 02/10/2020  . H/O: hysterectomy 02/10/2020  . Former smoker 02/10/2020  . Chronic low back pain 02/10/2020  . Disc disease, degenerative, cervical 02/10/2020  . Mild intermittent asthma without complication 02/10/2020  . Mild obstructive sleep apnea 10/28/2018  . Anxiety and depression 09/14/2018  . Dyspareunia, female 05/30/2017  . Subcutaneous nodule of breast 06/20/2016  . Hypothyroidism 09/12/2011    Past Surgical History:  Procedure Laterality Date  . ABDOMINAL HYSTERECTOMY    . BREAST SURGERY Left    lumpectomy    OB History   No obstetric history on file.      Home Medications    Prior to Admission medications   Medication Sig Start Date End Date Taking? Authorizing Provider  amoxicillin-clavulanate (AUGMENTIN) 875-125 MG tablet Take 1 tablet by mouth every 12 (twelve) hours. 12/03/20  Yes Eustace Moore, MD  Azelastine-Fluticasone [phone] MCG/ACT SUSP Place 1 puff into the nose in the morning and at  bedtime. 12/03/20  Yes Eustace Moore, MD  acetaminophen (TYLENOL) 500 MG tablet Take 500 mg by mouth every 6 (six) hours as needed.    [provider]  albuterol (VENTOLIN HFA) 108 (90 Base) MCG/ACT inhaler Inhale 1-2 puffs into the lungs every 6 (six) hours as needed for wheezing or shortness of breath. 02/10/20   Sunnie Nielsen, DO  estradiol (VIVELLE-DOT) 0.05 MG/24HR patch Place 1 patch onto the skin once a week. 01/27/20   [provider]  ibuprofen (ADVIL) 200 MG tablet Take 200 mg by mouth every 6 (six) hours as needed.    [provider]  levothyroxine (SYNTHROID) 100 MCG tablet Take 1 tablet (100 mcg total) by mouth daily before breakfast. 02/10/20   Sunnie Nielsen, DO  phentermine 37.5 MG capsule Take 37.5 mg by mouth every morning.    [provider]  predniSONE (DELTASONE) 20 MG tablet Take one tab by mouth twice daily for 4 days, then one daily. Take with food. 03/22/20   Lattie Haw, MD  budesonide (PULMICORT) 0.5 MG/2ML nebulizer solution Take 2 mLs (0.5 mg total) by nebulization 2 (two) times daily as needed. 03/14/20 12/03/20  Tollie Eth, NP  budesonide-formoterol (SYMBICORT) 160-4.5 MCG/ACT inhaler Inhale 2 puffs into the lungs 2 (two) times daily. Patient not taking: No sig reported 02/10/20 12/03/20  Sunnie Nielsen, DO    Family History Family History  Problem Relation Age of Onset  . Hypertension Mother   . Diabetes Mother   . Hypertension Father     Social History Social History   Tobacco Use  . Smoking status: Current Some Day Smoker    Packs/day: 0.30    Years: 25.00    Pack years: 7.50  . Smokeless tobacco: Never Used  Vaping Use  . Vaping Use: Never used  Substance Use Topics  . Alcohol use: Never  . Drug use: Never     Allergies   Nickel and Topiramate   Review of Systems Review of Systems See HPI  Physical Exam Triage Vital Signs ED Triage Vitals  Enc Vitals Group     BP 12/03/20 1104 117/79      Pulse Rate 12/03/20 1104 67     Resp 12/03/20 1104 16     Temp 12/03/20 1104 97.9 F (36.6 C)     Temp Source 12/03/20 1104 Oral     SpO2 12/03/20 1104 98 %     Weight 12/03/20 1105 208 lb (94.3 kg)     Height 12/03/20 1105 5\' 3"  (1.6 m)     Head Circumference --      Peak Flow --      Pain Score 12/03/20 1105 4     Pain Loc --      Pain Edu? --      Excl. in GC? --    No data found.  Updated Vital Signs BP 117/79 (BP Location: Right Arm)   Pulse 67   Temp 97.9 F (36.6 C) (Oral)   Resp 16   Ht 5\' 3"  (1.6 m)   Wt 94.3 kg   SpO2 98%   BMI 36.85 kg/m       Physical Exam Constitutional:      General: She is not in acute distress.    Appearance: She is well-developed.  HENT:     Head: Normocephalic and atraumatic.     Right Ear: Tympanic membrane, ear canal and external ear normal.     Left Ear: Tympanic membrane, ear canal and external ear normal.     Ears:     Comments: Left TM with fluid.  No erythema    Nose: Congestion present.     Mouth/Throat:     Pharynx: Posterior oropharyngeal erythema present.  Eyes:     Conjunctiva/sclera: Conjunctivae normal.     Pupils: Pupils are equal, round, and reactive to light.  Cardiovascular:     Rate and Rhythm: Normal rate.     Heart sounds: Normal heart sounds.  Pulmonary:     Effort: Pulmonary effort is normal. No respiratory distress.     Breath sounds: Normal breath sounds.  Abdominal:     General: There is no distension.     Palpations: Abdomen is soft.  Musculoskeletal:        General: Normal range of motion.     Cervical back: Normal range of motion.  Skin:    General: Skin is warm and dry.  Neurological:     Mental Status: She is alert.  Psychiatric:        Behavior: Behavior normal.      UC Treatments / Results  Labs (all labs ordered are listed, but only abnormal results are displayed) Labs Reviewed - No data to display  EKG   Radiology No results found.  Procedures Procedures  (including critical care time)  Medications Ordered in UC Medications - No data to display  Initial Impression /  Assessment and Plan / UC Course  I have reviewed the triage vital signs and the nursing notes.  Pertinent labs & imaging results that were available during my care of the patient were reviewed by me and considered in my medical decision making (see chart for details).     Patient has a viral URI.  No indication for antibiotics.  Discussed with patient.  She feels antibiotics are "always needed" for sinus infections.  I will give her a antibiotic to fill and use if she fails to improve by day 10.  Advised to call if she needs antibiotic. Final Clinical Impressions(s) / UC Diagnoses   Final diagnoses:  Viral respiratory illness     Discharge Instructions     Continue drinking lots of fluids Use the nasal spray as directed If you are not improved by day 10, or if you have significant worsening of symptoms fill and take antibiotic See your primary care in follow-up   ED Prescriptions    Medication Sig Dispense Auth. Provider   Azelastine-Fluticasone 137-50 MCG/ACT SUSP Place 1 puff into the nose in the morning and at bedtime. 23 g Eustace Moore, MD   amoxicillin-clavulanate (AUGMENTIN) 875-125 MG tablet Take 1 tablet by mouth every 12 (twelve) hours. 14 tablet Eustace Moore, MD     PDMP not reviewed this encounter.   Eustace Moore, MD 12/03/20 1534

## 2020-12-03 NOTE — ED Triage Notes (Signed)
Patient here day 4 facial pain/congestion; started on right side of face and now around left ear area; no fever; no cough. Has not had covid vaccinations nor influenza vaccination.

## 2020-12-03 NOTE — Discharge Instructions (Addendum)
Continue drinking lots of fluids Use the nasal spray as directed If you are not improved by day 10, or if you have significant worsening of symptoms fill and take antibiotic See your primary care in follow-up

## 2021-01-02 DIAGNOSIS — E039 Hypothyroidism, unspecified: Secondary | ICD-10-CM | POA: Diagnosis not present

## 2021-03-16 DIAGNOSIS — Z8742 Personal history of other diseases of the female genital tract: Secondary | ICD-10-CM | POA: Diagnosis not present

## 2021-03-16 LAB — HM PAP SMEAR: HM Pap smear: NEGATIVE

## 2021-07-10 DIAGNOSIS — R0602 Shortness of breath: Secondary | ICD-10-CM | POA: Diagnosis not present

## 2021-07-10 DIAGNOSIS — J208 Acute bronchitis due to other specified organisms: Secondary | ICD-10-CM | POA: Diagnosis not present

## 2021-07-10 DIAGNOSIS — R059 Cough, unspecified: Secondary | ICD-10-CM | POA: Diagnosis not present

## 2021-07-10 DIAGNOSIS — F419 Anxiety disorder, unspecified: Secondary | ICD-10-CM | POA: Insufficient documentation

## 2021-08-07 ENCOUNTER — Ambulatory Visit: Payer: BC Managed Care – PPO | Admitting: Sports Medicine

## 2021-08-07 ENCOUNTER — Other Ambulatory Visit: Payer: Self-pay

## 2021-08-07 ENCOUNTER — Ambulatory Visit (INDEPENDENT_AMBULATORY_CARE_PROVIDER_SITE_OTHER): Payer: BC Managed Care – PPO

## 2021-08-07 DIAGNOSIS — M47816 Spondylosis without myelopathy or radiculopathy, lumbar region: Secondary | ICD-10-CM

## 2021-08-07 DIAGNOSIS — M549 Dorsalgia, unspecified: Secondary | ICD-10-CM

## 2021-08-07 DIAGNOSIS — M545 Low back pain, unspecified: Secondary | ICD-10-CM | POA: Diagnosis not present

## 2021-08-07 DIAGNOSIS — M2578 Osteophyte, vertebrae: Secondary | ICD-10-CM | POA: Diagnosis not present

## 2021-08-07 MED ORDER — PREDNISONE 10 MG (48) PO TBPK
ORAL_TABLET | Freq: Every day | ORAL | 0 refills | Status: DC
Start: 2021-08-07 — End: 2021-09-13

## 2021-08-07 NOTE — Progress Notes (Signed)
    Procedures performed today:    None.  Independent interpretation of notes and tests performed by another provider:   None.  Brief History, Exam, Impression, and Recommendations:    Lumbar spondylosis Jackline is a pleasant 50 year old female, she has a long history of axial low back pain, bilateral worse with standing, better with sitting. Nothing radicular, no red flag symptoms, x-rays in the past have shown facet and disc DDD. Exam is fairly benign, no discrete areas of tenderness to palpation over her midline, paraspinal muscles, or SI joints. We will treat her conservatively, we will do a taper of prednisone as she just finished a burst for bronchitis, formal physical therapy, updated x-rays. Return to see me in 6 weeks, MRI for interventional planning if no better, we did discuss the evolutionary anthropology of lumbar disc and facet disease. I have also talked to her about establishing care with Dr. Ashley Royalty for further discussion of aggressive weight loss treatment including GLP-1 treatments.    ___________________________________________ Ihor Austin. Benjamin Stain, M.D., ABFM., CAQSM. Primary Care and Sports Medicine [facility]  Adjunct Instructor of Family Medicine  [employer]

## 2021-08-07 NOTE — Assessment & Plan Note (Signed)
Alexandra Greer is a pleasant 50 year old female, she has a long history of axial low back pain, bilateral worse with standing, better with sitting. Nothing radicular, no red flag symptoms, x-rays in the past have shown facet and disc DDD. Exam is fairly benign, no discrete areas of tenderness to palpation over her midline, paraspinal muscles, or SI joints. We will treat her conservatively, we will do a taper of prednisone as she just finished a burst for bronchitis, formal physical therapy, updated x-rays. Return to see me in 6 weeks, MRI for interventional planning if no better, we did discuss the evolutionary anthropology of lumbar disc and facet disease. I have also talked to her about establishing care with Dr. Ashley Royalty for further discussion of aggressive weight loss treatment including GLP-1 treatments.

## 2021-08-22 ENCOUNTER — Telehealth: Payer: Self-pay

## 2021-08-22 NOTE — Telephone Encounter (Signed)
Patient called to report that she has not heard anything from the PT facility and at this point has decided not to do PT at all. She will follow up with you in January.

## 2021-09-13 ENCOUNTER — Other Ambulatory Visit: Payer: Self-pay | Admitting: Family Medicine

## 2021-09-13 ENCOUNTER — Other Ambulatory Visit: Payer: Self-pay

## 2021-09-13 ENCOUNTER — Ambulatory Visit (INDEPENDENT_AMBULATORY_CARE_PROVIDER_SITE_OTHER): Payer: BC Managed Care – PPO | Admitting: Family Medicine

## 2021-09-13 ENCOUNTER — Encounter: Payer: Self-pay | Admitting: Family Medicine

## 2021-09-13 VITALS — BP 142/88 | HR 96 | Temp 97.3°F | Ht 63.0 in | Wt 230.0 lb

## 2021-09-13 DIAGNOSIS — E039 Hypothyroidism, unspecified: Secondary | ICD-10-CM

## 2021-09-13 DIAGNOSIS — Z6841 Body Mass Index (BMI) 40.0 and over, adult: Secondary | ICD-10-CM | POA: Insufficient documentation

## 2021-09-13 DIAGNOSIS — R635 Abnormal weight gain: Secondary | ICD-10-CM | POA: Diagnosis not present

## 2021-09-13 MED ORDER — SEMAGLUTIDE-WEIGHT MANAGEMENT 0.25 MG/0.5ML ~~LOC~~ SOAJ: 0.2500 mg | SUBCUTANEOUS | 0 refills | Status: DC

## 2021-09-13 MED ORDER — SEMAGLUTIDE-WEIGHT MANAGEMENT 1 MG/0.5ML ~~LOC~~ SOAJ: 1.0000 mg | SUBCUTANEOUS | 0 refills | Status: DC

## 2021-09-13 MED ORDER — SEMAGLUTIDE-WEIGHT MANAGEMENT 1.7 MG/0.75ML ~~LOC~~ SOAJ: 1.7000 mg | SUBCUTANEOUS | 0 refills | Status: DC

## 2021-09-13 MED ORDER — SEMAGLUTIDE-WEIGHT MANAGEMENT 2.4 MG/0.75ML ~~LOC~~ SOAJ: 2.4000 mg | SUBCUTANEOUS | 1 refills | Status: DC

## 2021-09-13 MED ORDER — SEMAGLUTIDE-WEIGHT MANAGEMENT 0.5 MG/0.5ML ~~LOC~~ SOAJ: 0.5000 mg | SUBCUTANEOUS | 0 refills | Status: DC

## 2021-09-13 NOTE — Assessment & Plan Note (Signed)
Recheck TSH today.  We will make adjustments if needed.

## 2021-09-13 NOTE — Progress Notes (Signed)
Alexandra Greer - 51 y.o. female MRN [id number]  Date of birth: 1970/12/30  Subjective No chief complaint on file.   HPI Alexandra Greer is a 51 year old female here today for follow-up visit.  She is transferring care from Dr. Lyn Hollingshead.  She has been seeing Dr. Benjamin Stain for low back pain.  She has had increasing difficulty with exercise tolerance due to increased back pain.  She would like to discuss options for weight management to improve her exercise tolerance as well as improve her back pain.  She has tried phentermine as well as hCG injections in the past.  She did have some weight loss with this but regained the weight after discontinuing.  She does have history of hypothyroidism and is due for updated thyroid labs.  Currently her exercise is limited.  She feels like her diet is pretty good however she would be interested in seeing a dietitian to see if there may be some areas she can work on.  ROS:  A comprehensive ROS was completed and negative except as noted per HPI  Allergies  Allergen Reactions   Nickel Itching, Rash and Hives   Topiramate     Past Medical History:  Diagnosis Date   Anxiety    Asthma    Colon polyps    Constipation    Depression    Hypothyroid    Joint pain    Low thyroid stimulating hormone (TSH) level     Past Surgical History:  Procedure Laterality Date   ABDOMINAL HYSTERECTOMY     BREAST SURGERY Left    lumpectomy    Social History   Socioeconomic History   Marital status: Married    Spouse name: Not on file   Number of children: Not on file   Years of education: Not on file   Highest education level: Not on file  Occupational History   Not on file  Tobacco Use   Smoking status: Some Days    Packs/day: 0.30    Years: 25.00    Pack years: 7.50    Types: Cigarettes   Smokeless tobacco: Never  Vaping Use   Vaping Use: Never used  Substance and Sexual Activity   Alcohol use: Never   Drug use: Never   Sexual activity: Yes     Partners: Male  Other Topics Concern   Not on file  Social History Narrative   Not on file   Social Determinants of Health   Financial Resource Strain: Not on file  Food Insecurity: Not on file  Transportation Needs: Not on file  Physical Activity: Not on file  Stress: Not on file  Social Connections: Not on file    Family History  Problem Relation Age of Onset   Hypertension Mother    Diabetes Mother    Hypertension Father     Health Maintenance  Topic Date Due   COVID-19 Vaccine (1) Never done   Pneumococcal Vaccine 33-68 Years old (1 - PCV) Never done   HIV Screening  Never done   Hepatitis C Screening  Never done   PAP SMEAR-Modifier  Never done   Zoster Vaccines- Shingrix (1 of 2) Never done   INFLUENZA VACCINE  Never done   MAMMOGRAM  11/10/2022   COLONOSCOPY (Pts 45-7yrs Insurance coverage will need to be confirmed)  02/16/2025   TETANUS/TDAP  10/18/2029   HPV VACCINES  Aged Out     ----------------------------------------------------------------------------------------------------------------------------------------------------------------------------------------------------------------- Physical Exam BP (!) 142/88 (BP Location: Left Arm, Patient Position: Sitting, Cuff Size: Large)  Pulse 96    Temp (!) 97.3 F (36.3 C)    Ht 5\' 3"  (1.6 m)    Wt 230 lb (104.3 kg)    SpO2 96%    BMI 40.74 kg/m   Physical Exam Constitutional:      Appearance: Normal appearance.  HENT:     Head: Normocephalic and atraumatic.  Eyes:     General: No scleral icterus. Cardiovascular:     Rate and Rhythm: Normal rate and regular rhythm.  Pulmonary:     Effort: Pulmonary effort is normal.     Breath sounds: Normal breath sounds.  Musculoskeletal:     Cervical back: Neck supple.  Neurological:     Mental Status: She is alert.  Psychiatric:        Mood and Affect: Mood normal.        Behavior: Behavior normal.     ------------------------------------------------------------------------------------------------------------------------------------------------------------------------------------------------------------------- Assessment and Plan  Hypothyroidism Recheck TSH today.  We will make adjustments if needed.  Class 3 severe obesity due to excess calories without serious comorbidity with body mass index (BMI) of 40.0 to 44.9 in adult [facility]) Obesity likely contributing to and/or worsening her chronic low back pain.  We discussed options for management of weight.  We will see if we can get [facility] or similar medication approved.  Discussed potential side effects related to [facility] and medications in this class.  I will plan to follow-up with her in approximately 6 weeks after she starts medication.   Meds ordered this encounter  Medications   Semaglutide-Weight Management 0.25 MG/0.5ML SOAJ    Sig: Inject 0.25 mg into the skin once a week for 28 days.    Dispense:  2 mL    Refill:  0   Semaglutide-Weight Management 1 MG/0.5ML SOAJ    Sig: Inject 1 mg into the skin once a week for 28 days.    Dispense:  2 mL    Refill:  0   DISCONTD: Semaglutide-Weight Management 1.7 MG/0.75ML SOAJ    Sig: Inject 1.7 mg into the skin once a week for 28 days.    Dispense:  3 mL    Refill:  0   DISCONTD: Semaglutide-Weight Management 2.4 MG/0.75ML SOAJ    Sig: Inject 2.4 mg into the skin once a week for 28 days.    Dispense:  3 mL    Refill:  1   Semaglutide-Weight Management 0.5 MG/0.5ML SOAJ    Sig: Inject 0.5 mg into the skin once a week for 28 days.    Dispense:  2 mL    Refill:  0    No follow-ups on file.    This visit occurred during the SARS-CoV-2 public health emergency.  Safety protocols were in place, including screening questions prior to the visit, additional usage of staff PPE, and extensive cleaning of exam room while observing appropriate contact time as indicated for disinfecting  solutions.

## 2021-09-13 NOTE — Assessment & Plan Note (Signed)
Obesity likely contributing to and/or worsening her chronic low back pain.  We discussed options for management of weight.  We will see if we can get [facility] or similar medication approved.  Discussed potential side effects related to [facility] and medications in this class.  I will plan to follow-up with her in approximately 6 weeks after she starts medication.

## 2021-09-14 LAB — COMPLETE METABOLIC PANEL WITH GFR
AG Ratio: 2.3 (calc) (ref 1.0–2.5)
ALT: 33 U/L — ABNORMAL HIGH (ref 6–29)
AST: 22 U/L (ref 10–35)
Albumin: 4.6 g/dL (ref 3.6–5.1)
Alkaline phosphatase (APISO): 56 U/L (ref 37–153)
BUN: 12 mg/dL (ref 7–25)
CO2: 31 mmol/L (ref 20–32)
Calcium: 9.4 mg/dL (ref 8.6–10.4)
Chloride: 102 mmol/L (ref 98–110)
Creat: 0.99 mg/dL (ref 0.50–1.03)
Globulin: 2 g/dL (calc) (ref 1.9–3.7)
Glucose, Bld: 111 mg/dL — ABNORMAL HIGH (ref 65–99)
Potassium: 3.9 mmol/L (ref 3.5–5.3)
Sodium: 140 mmol/L (ref 135–146)
Total Bilirubin: 0.4 mg/dL (ref 0.2–1.2)
Total Protein: 6.6 g/dL (ref 6.1–8.1)
eGFR: 69 mL/min/{1.73_m2} (ref >=60)

## 2021-09-14 LAB — CBC WITH DIFFERENTIAL/PLATELET
Absolute Monocytes: 580 cells/uL (ref 200–950)
Basophils Absolute: 88 cells/uL (ref 0–200)
Basophils Relative: 1.4 %
Eosinophils Absolute: 328 cells/uL (ref 15–500)
Eosinophils Relative: 5.2 %
HCT: 44.6 % (ref 35.0–45.0)
Hemoglobin: 15.1 g/dL (ref 11.7–15.5)
Lymphs Abs: 1581 cells/uL (ref 850–3900)
MCH: 31.5 pg (ref 27.0–33.0)
MCHC: 33.9 g/dL (ref 32.0–36.0)
MCV: 92.9 fL (ref 80.0–100.0)
MPV: 10.6 fL (ref 7.5–12.5)
Monocytes Relative: 9.2 %
Neutro Abs: 3723 cells/uL (ref 1500–7800)
Neutrophils Relative %: 59.1 %
Platelets: 290 10*3/uL (ref 140–400)
RBC: 4.8 10*6/uL (ref 3.80–5.10)
RDW: 11.8 % (ref 11.0–15.0)
Total Lymphocyte: 25.1 %
WBC: 6.3 10*3/uL (ref 3.8–10.8)

## 2021-09-14 LAB — HEMOGLOBIN A1C
Hgb A1c MFr Bld: 5.3 % of total Hgb (ref <5.7)
Mean Plasma Glucose: 105 mg/dL
eAG (mmol/L): 5.8 mmol/L

## 2021-09-14 LAB — TSH: TSH: 1.4 mIU/L

## 2021-09-18 ENCOUNTER — Other Ambulatory Visit: Payer: Self-pay

## 2021-09-18 ENCOUNTER — Ambulatory Visit: Payer: BC Managed Care – PPO | Admitting: Sports Medicine

## 2021-09-18 ENCOUNTER — Telehealth: Payer: Self-pay

## 2021-09-18 DIAGNOSIS — M47816 Spondylosis without myelopathy or radiculopathy, lumbar region: Secondary | ICD-10-CM | POA: Diagnosis not present

## 2021-09-18 MED ORDER — TRIAZOLAM 0.25 MG PO TABS: ORAL_TABLET | ORAL | 0 refills | Status: DC

## 2021-09-18 NOTE — Progress Notes (Signed)
° ° °  Procedures performed today:    None.  Independent interpretation of notes and tests performed by another provider:   None.  Brief History, Exam, Impression, and Recommendations:    Lumbar spondylosis This is a pleasant 51 year old female returns, long history of axial low back pain, bilateral, worse with standing and better with sitting, she had no red flag symptoms, at this point she has failed greater than 6 weeks of physician directed conservative treatment, x-rays did show multilevel widespread degenerative processes in the discs, facets, SI joints, all likely related to her weight. She did discuss her weight with Dr. Ashley Royalty and had [facility] prescribed, it does not sound like a PA has been initiated yet. At this point due to failure of conservative treatment we will proceed with MRI for interventional planning, as her pain is worse with extension we will likely be targeting her facets or spinal stenosis. Return to go over MRI results. Triazolam for preprocedural anxiolysis, she will have a driver.    ___________________________________________ Ihor Austin. Benjamin Stain, M.D., ABFM., CAQSM. Primary Care and Sports Medicine [facility]  Adjunct Instructor of Family Medicine  [employer]

## 2021-09-18 NOTE — Telephone Encounter (Signed)
Medication: WEGOVY 1.7 MG/0.75ML SOAJ Prior authorization submitted via CoverMyMeds on 09/18/2021 PA submission pending  Pt aware of PA status by MyChart message

## 2021-09-18 NOTE — Assessment & Plan Note (Signed)
This is a pleasant 51 year old female returns, long history of axial low back pain, bilateral, worse with standing and better with sitting, she had no red flag symptoms, at this point she has failed greater than 6 weeks of physician directed conservative treatment, x-rays did show multilevel widespread degenerative processes in the discs, facets, SI joints, all likely related to her weight. She did discuss her weight with Dr. Zigmund Daniel and had [facility] prescribed, it does not sound like a PA has been initiated yet. At this point due to failure of conservative treatment we will proceed with MRI for interventional planning, as her pain is worse with extension we will likely be targeting her facets or spinal stenosis. Return to go over MRI results. Triazolam for preprocedural anxiolysis, she will have a driver.

## 2021-09-19 ENCOUNTER — Encounter: Payer: Self-pay | Admitting: Family Medicine

## 2021-09-19 ENCOUNTER — Encounter: Payer: Self-pay | Admitting: Sports Medicine

## 2021-09-19 DIAGNOSIS — M47816 Spondylosis without myelopathy or radiculopathy, lumbar region: Secondary | ICD-10-CM

## 2021-09-19 MED ORDER — GABAPENTIN 300 MG PO CAPS
ORAL_CAPSULE | ORAL | 3 refills | Status: DC
Start: 2021-09-19 — End: 2021-11-08

## 2021-09-19 MED ORDER — NAPROXEN 500 MG PO TABS: 500.0000 mg | ORAL_TABLET | Freq: Two times a day (BID) | ORAL | 3 refills | Status: DC

## 2021-09-30 ENCOUNTER — Other Ambulatory Visit: Payer: Self-pay | Admitting: Family Medicine

## 2021-10-03 ENCOUNTER — Other Ambulatory Visit: Payer: Self-pay | Admitting: Family Medicine

## 2021-10-09 ENCOUNTER — Telehealth: Payer: Self-pay

## 2021-10-09 NOTE — Telephone Encounter (Signed)
Medication: Semaglutide-Weight Management 0.25 MG/0.5ML SOAJ Prior authorization submitted via CoverMyMeds on 10/09/2021 PA submission pending

## 2021-10-10 NOTE — Telephone Encounter (Signed)
Patient aware that both Wegovy and Ozempic have been denied by her insurance. She wants to know the next step. Please advise.

## 2021-10-10 NOTE — Telephone Encounter (Signed)
She can check with insurance to see if Alexandra Greer may be covered.    CM

## 2021-10-10 NOTE — Telephone Encounter (Signed)
Medication: Semaglutide-Weight Management 0.25 MG/0.5ML SOAJ Prior authorization determination received Medication has been denied Reason for denial:  "This is a duplicate of a request that is either already under review, or a decision has already been made."   Left msg for patient to return call to office to discuss PA.

## 2021-10-22 ENCOUNTER — Other Ambulatory Visit: Payer: Self-pay

## 2021-11-08 ENCOUNTER — Encounter: Payer: Self-pay | Admitting: Family Medicine

## 2021-11-08 ENCOUNTER — Ambulatory Visit: Payer: BC Managed Care – PPO | Admitting: Family Medicine

## 2021-11-08 ENCOUNTER — Other Ambulatory Visit: Payer: Self-pay

## 2021-11-08 DIAGNOSIS — Z6841 Body Mass Index (BMI) 40.0 and over, adult: Secondary | ICD-10-CM

## 2021-11-08 MED ORDER — AMBULATORY NON FORMULARY MEDICATION: 1 refills | Status: DC

## 2021-11-08 NOTE — Progress Notes (Signed)
?Alexandra Greer - 51 y.o. female MRN [id number]  Date of birth: May 05, 1971 ? ?Subjective ?Chief Complaint  ?Patient presents with  ? Weight Loss  ? ? ?HPI ?Alexandra Greer is a 51 year old female here today for follow-up of weight management.  Unfortunately her insurance would not cover GLP-1 medications for weight loss.  She has continued to try to work on diet and exercise efforts.  Despite this her weight is up about 5 pounds since her last visit. ? ?ROS:  A comprehensive ROS was completed and negative except as noted per HPI ? ?Allergies  ?Allergen Reactions  ? Nickel Itching, Rash and Hives  ? Topiramate   ? ? ?Past Medical History:  ?Diagnosis Date  ? Anxiety   ? Asthma   ? Colon polyps   ? Constipation   ? Depression   ? Hypothyroid   ? Joint pain   ? Low thyroid stimulating hormone (TSH) level   ? ? ?Past Surgical History:  ?Procedure Laterality Date  ? ABDOMINAL HYSTERECTOMY    ? BREAST SURGERY Left   ? lumpectomy  ? ? ?Social History  ? ?Socioeconomic History  ? Marital status: Married  ?  Spouse name: Not on file  ? Number of children: Not on file  ? Years of education: Not on file  ? Highest education level: Not on file  ?Occupational History  ? Not on file  ?Tobacco Use  ? Smoking status: Some Days  ?  Packs/day: 0.30  ?  Years: 25.00  ?  Pack years: 7.50  ?  Types: Cigarettes  ? Smokeless tobacco: Never  ?Vaping Use  ? Vaping Use: Never used  ?Substance and Sexual Activity  ? Alcohol use: Never  ? Drug use: Never  ? Sexual activity: Yes  ?  Partners: Male  ?Other Topics Concern  ? Not on file  ?Social History Narrative  ? Not on file  ? ?Social Determinants of Health  ? ?Financial Resource Strain: Not on file  ?Food Insecurity: Not on file  ?Transportation Needs: Not on file  ?Physical Activity: Not on file  ?Stress: Not on file  ?Social Connections: Not on file  ? ? ?Family History  ?Problem Relation Age of Onset  ? Hypertension Mother   ? Diabetes Mother   ? Hypertension Father   ? ? ?Health Maintenance   ?Topic Date Due  ? HIV Screening  Never done  ? Hepatitis C Screening  Never done  ? COVID-19 Vaccine (1) 11/24/2021 (Originally 04/24/1971)  ? INFLUENZA VACCINE  12/07/2021 (Originally 04/09/2021)  ? Zoster Vaccines- Shingrix (1 of 2) 02/08/2022 (Originally 10/25/2018)  ? MAMMOGRAM  11/10/2022  ? PAP SMEAR-Modifier  03/16/2024  ? COLONOSCOPY (Pts 45-76yrs Insurance coverage will need to be confirmed)  02/16/2025  ? TETANUS/TDAP  10/18/2029  ? HPV VACCINES  Aged Out  ? ? ? ?----------------------------------------------------------------------------------------------------------------------------------------------------------------------------------------------------------------- ?Physical Exam ?BP 138/83 (BP Location: Left Arm, Patient Position: Sitting, Cuff Size: Large)   Pulse 67   Temp 97.8 ?F (36.6 ?C)   Ht 5\' 3"  (1.6 m)   Wt 235 lb (106.6 kg)   SpO2 99%   BMI 41.63 kg/m?  ? ?Physical Exam ?Constitutional:   ?   Appearance: Normal appearance.  ?Musculoskeletal:  ?   Cervical back: Neck supple.  ?Neurological:  ?   Mental Status: She is alert.  ?Psychiatric:     ?   Mood and Affect: Mood normal.     ?   Behavior: Behavior normal.  ? ? ?------------------------------------------------------------------------------------------------------------------------------------------------------------------------------------------------------------------- ?Assessment and  Plan ? ?Class 3 severe obesity due to excess calories without serious comorbidity with body mass index (BMI) of 40.0 to 44.9 in adult [facility]) ?Unfortunately commercially available GLP-1 medications are not covered by her insurance.  We discussed that there is a compounding pharmacy locally that can compound semaglutide for injection.  She would like to try this.  Continue to work on diet and exercise changes well. ? ? ?Meds ordered this encounter  ?Medications  ? AMBULATORY NON FORMULARY MEDICATION  ?  Sig: Semaglutide 2.5 mg/mL + Vit B6 10mg /mL.    ? ?Inject 10 units/0.25 mg subcutaneous weekly for 4 weeks then 20 units/0.5 mg subcutaneous weekly for 4 weeks, then 40 units/1 mg weekly. Dispense 90mL.  ?  Dispense:  2 mL  ?  Refill:  1  ? ? ?Return in about 3 months (around 02/08/2022) for Weight management.  . ? ? ? ?This visit occurred during the SARS-CoV-2 public health emergency.  Safety protocols were in place, including screening questions prior to the visit, additional usage of staff PPE, and extensive cleaning of exam room while observing appropriate contact time as indicated for disinfecting solutions.  ? ?

## 2021-11-08 NOTE — Patient Instructions (Addendum)
?  [facility] ?[address], [address] ?[place], [place] [address] ?[phone] ? ?3 months/$180 ? ? ?

## 2021-11-08 NOTE — Assessment & Plan Note (Signed)
Unfortunately commercially available GLP-1 medications are not covered by her insurance.  We discussed that there is a compounding pharmacy locally that can compound semaglutide for injection.  She would like to try this.  Continue to work on diet and exercise changes well. ?

## 2021-11-14 DIAGNOSIS — Z1231 Encounter for screening mammogram for malignant neoplasm of breast: Secondary | ICD-10-CM | POA: Diagnosis not present

## 2021-11-14 LAB — HM MAMMOGRAPHY

## 2021-11-16 ENCOUNTER — Encounter: Payer: Self-pay | Admitting: Family Medicine

## 2022-01-11 DIAGNOSIS — R208 Other disturbances of skin sensation: Secondary | ICD-10-CM | POA: Diagnosis not present

## 2022-01-11 DIAGNOSIS — L918 Other hypertrophic disorders of the skin: Secondary | ICD-10-CM | POA: Diagnosis not present

## 2022-01-11 DIAGNOSIS — D1801 Hemangioma of skin and subcutaneous tissue: Secondary | ICD-10-CM | POA: Diagnosis not present

## 2022-01-11 DIAGNOSIS — L820 Inflamed seborrheic keratosis: Secondary | ICD-10-CM | POA: Diagnosis not present

## 2022-01-11 DIAGNOSIS — L814 Other melanin hyperpigmentation: Secondary | ICD-10-CM | POA: Diagnosis not present

## 2022-01-11 DIAGNOSIS — R202 Paresthesia of skin: Secondary | ICD-10-CM | POA: Diagnosis not present

## 2022-02-08 ENCOUNTER — Encounter: Payer: Self-pay | Admitting: Family Medicine

## 2022-02-08 ENCOUNTER — Ambulatory Visit: Payer: BC Managed Care – PPO | Admitting: Family Medicine

## 2022-02-08 DIAGNOSIS — Z6841 Body Mass Index (BMI) 40.0 and over, adult: Secondary | ICD-10-CM

## 2022-02-08 NOTE — Patient Instructions (Signed)
Let's continue current medication.  Follow up with me in 3 months

## 2022-02-10 NOTE — Assessment & Plan Note (Signed)
Doing well with compounding some appetite at this time as her insurance did not provide any weight loss benefits, unfortunately.  We will continue at current strength.  Continue to work on dietary change and regular exercise.

## 2022-02-10 NOTE — Progress Notes (Signed)
Alexandra Greer - 51 y.o. female MRN [id number]  Date of birth: 31-Dec-1970  Subjective No chief complaint on file.   HPI Alexandra Greer is a 51 year old female here today for follow-up of weight management.  She has been taking compounded semaglutide and is doing quite well with this.  She has had no issues with tolerance so far.  Her weight is down about 15 pounds since her visit in March.  She has had more energy and has become more active as well.  She does feel like she is getting some appetite suppression from this.  ROS:  A comprehensive ROS was completed and negative except as noted per HPI  Allergies  Allergen Reactions   Nickel Itching, Rash and Hives   Topiramate     Past Medical History:  Diagnosis Date   Anxiety    Asthma    Colon polyps    Constipation    Depression    Hypothyroid    Joint pain    Low thyroid stimulating hormone (TSH) level     Past Surgical History:  Procedure Laterality Date   ABDOMINAL HYSTERECTOMY     BREAST SURGERY Left    lumpectomy    Social History   Socioeconomic History   Marital status: Married    Spouse name: Not on file   Number of children: Not on file   Years of education: Not on file   Highest education level: Not on file  Occupational History   Not on file  Tobacco Use   Smoking status: Some Days    Packs/day: 0.30    Years: 25.00    Pack years: 7.50    Types: Cigarettes   Smokeless tobacco: Never  Vaping Use   Vaping Use: Never used  Substance and Sexual Activity   Alcohol use: Never   Drug use: Never   Sexual activity: Yes    Partners: Male  Other Topics Concern   Not on file  Social History Narrative   Not on file   Social Determinants of Health   Financial Resource Strain: Not on file  Food Insecurity: Not on file  Transportation Needs: Not on file  Physical Activity: Not on file  Stress: Not on file  Social Connections: Not on file    Family History  Problem Relation Age of Onset   Hypertension  Mother    Diabetes Mother    Hypertension Father     Health Maintenance  Topic Date Due   COVID-19 Vaccine (1) Never done   HIV Screening  Never done   Hepatitis C Screening  Never done   Zoster Vaccines- Shingrix (1 of 2) Never done   INFLUENZA VACCINE  04/09/2022   MAMMOGRAM  11/15/2023   PAP SMEAR-Modifier  03/16/2024   COLONOSCOPY (Pts 45-77yrs Insurance coverage will need to be confirmed)  02/16/2025   TETANUS/TDAP  10/18/2029   HPV VACCINES  Aged Out     ----------------------------------------------------------------------------------------------------------------------------------------------------------------------------------------------------------------- Physical Exam BP 122/79 (BP Location: Left Arm, Patient Position: Sitting, Cuff Size: Large)   Pulse 77   Ht 5\' 3"  (1.6 m)   Wt 221 lb (100.2 kg)   SpO2 97%   BMI 39.15 kg/m   Physical Exam Constitutional:      Appearance: Normal appearance.  HENT:     Head: Normocephalic and atraumatic.  Eyes:     General: No scleral icterus. Cardiovascular:     Rate and Rhythm: Normal rate and regular rhythm.  Neurological:     Mental Status: She is alert.  Psychiatric:        Mood and Affect: Mood normal.        Behavior: Behavior normal.    ------------------------------------------------------------------------------------------------------------------------------------------------------------------------------------------------------------------- Assessment and Plan  Class 3 severe obesity due to excess calories without serious comorbidity with body mass index (BMI) of 40.0 to 44.9 in adult [facility]) Doing well with compounding some appetite at this time as her insurance did not provide any weight loss benefits, unfortunately.  We will continue at current strength.  Continue to work on dietary change and regular exercise.   No orders of the defined types were placed in this encounter.   Return in about 3 months  (around 05/11/2022) for Weight management. .    This visit occurred during the SARS-CoV-2 public health emergency.  Safety protocols were in place, including screening questions prior to the visit, additional usage of staff PPE, and extensive cleaning of exam room while observing appropriate contact time as indicated for disinfecting solutions.

## 2022-02-11 IMAGING — DX DG LUMBAR SPINE COMPLETE 4+V
5 series · 5 of 5 positions shown · non-contrast
Comparison: None.

CLINICAL DATA: Right-sided back pain radiating into the hips,
initial encounter

EXAM:
LUMBAR SPINE - COMPLETE 4+ VIEW

[l-spine ap]
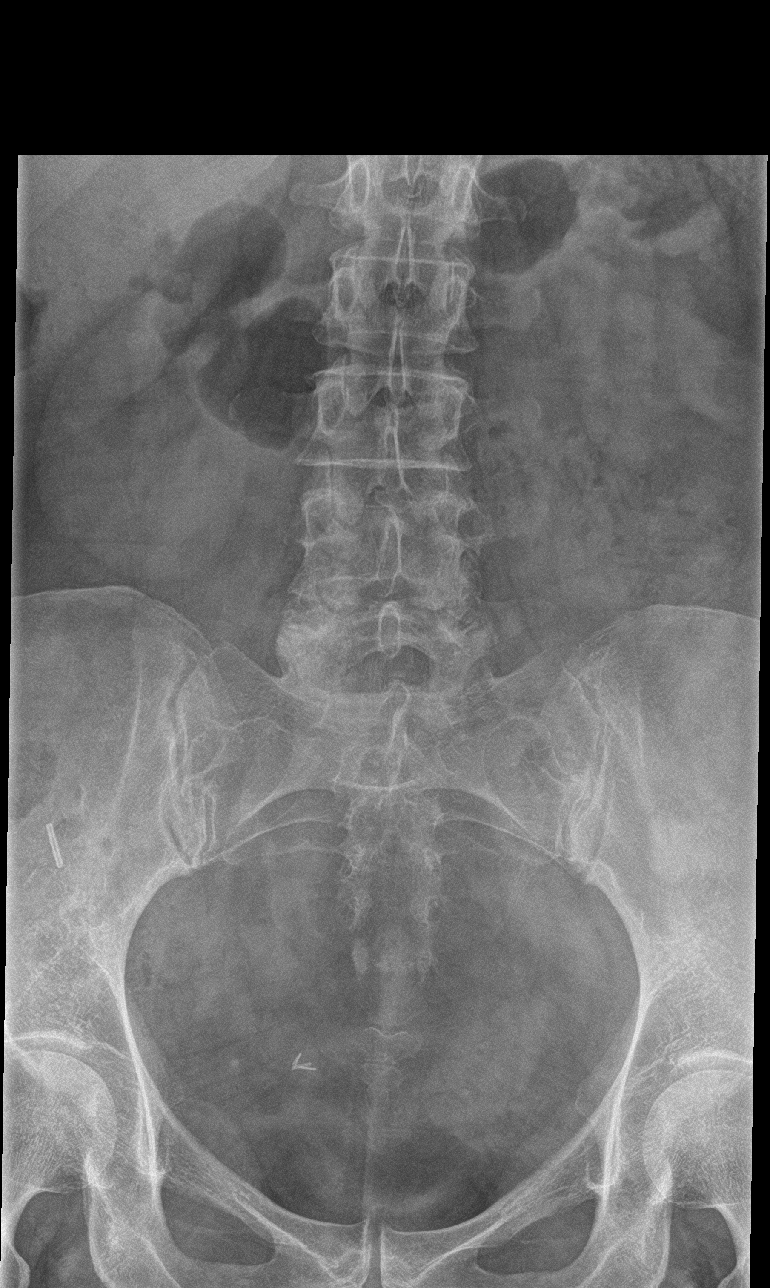

[l-spine obl (1 of 2)]
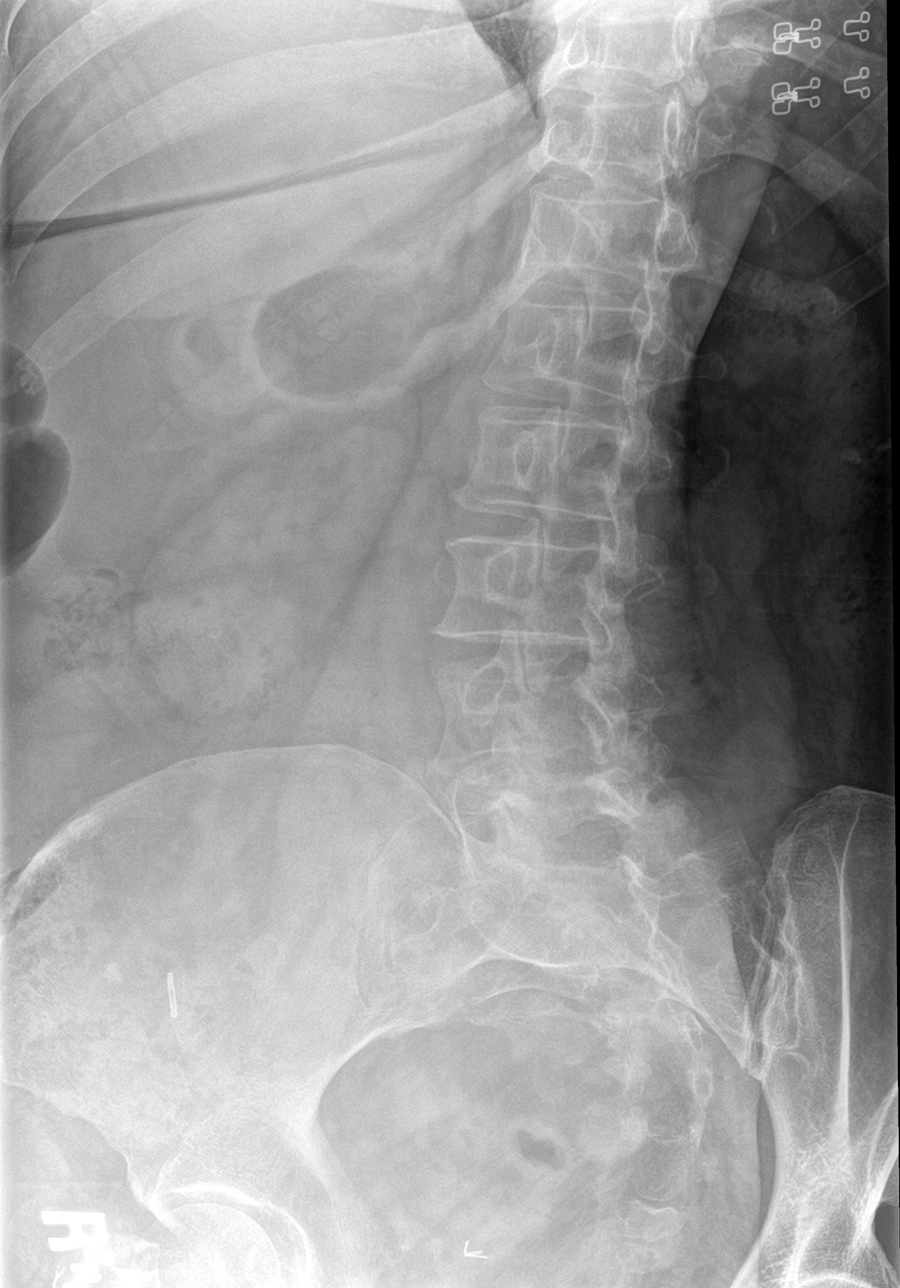

[l-spine obl (2 of 2)]
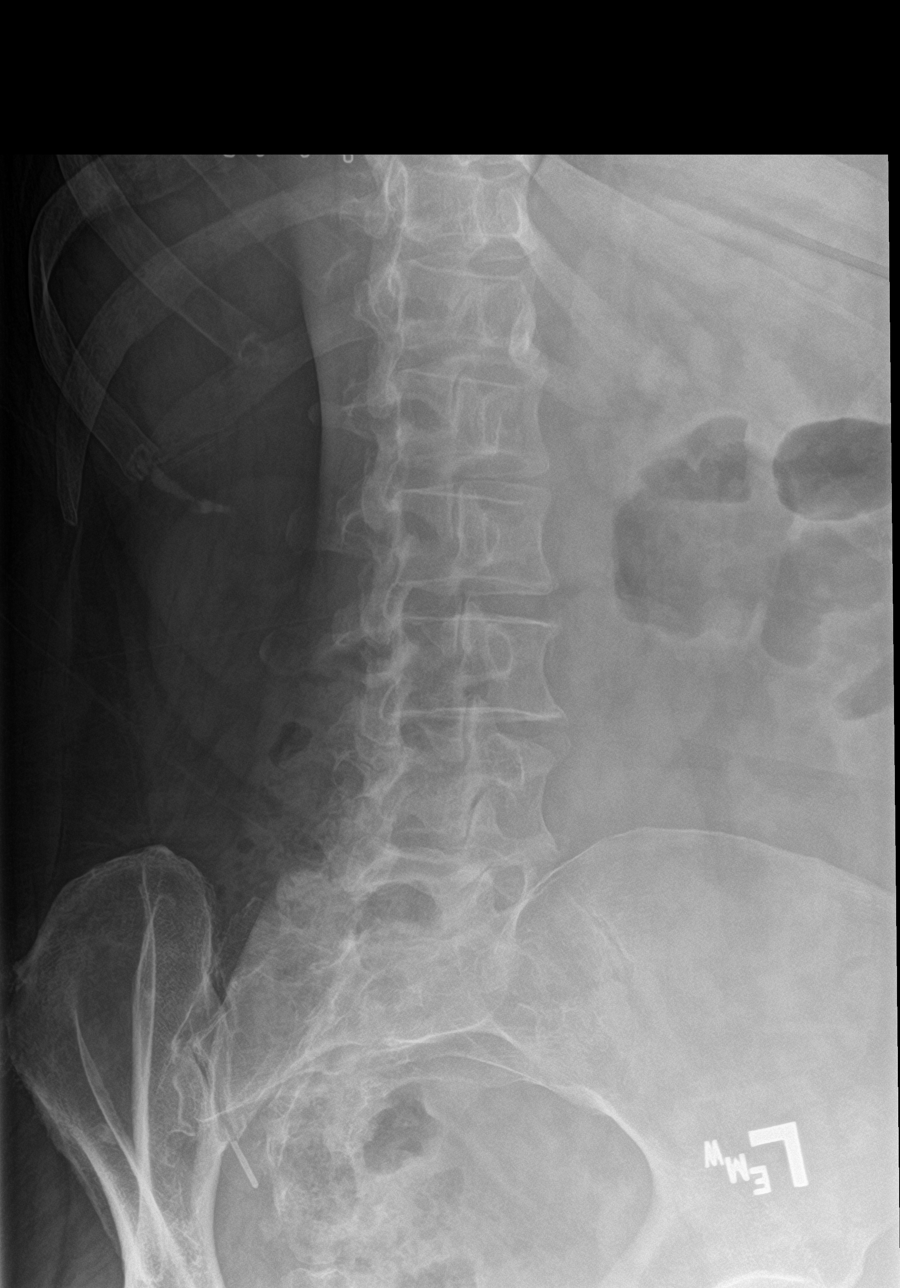

[l-spine lat]
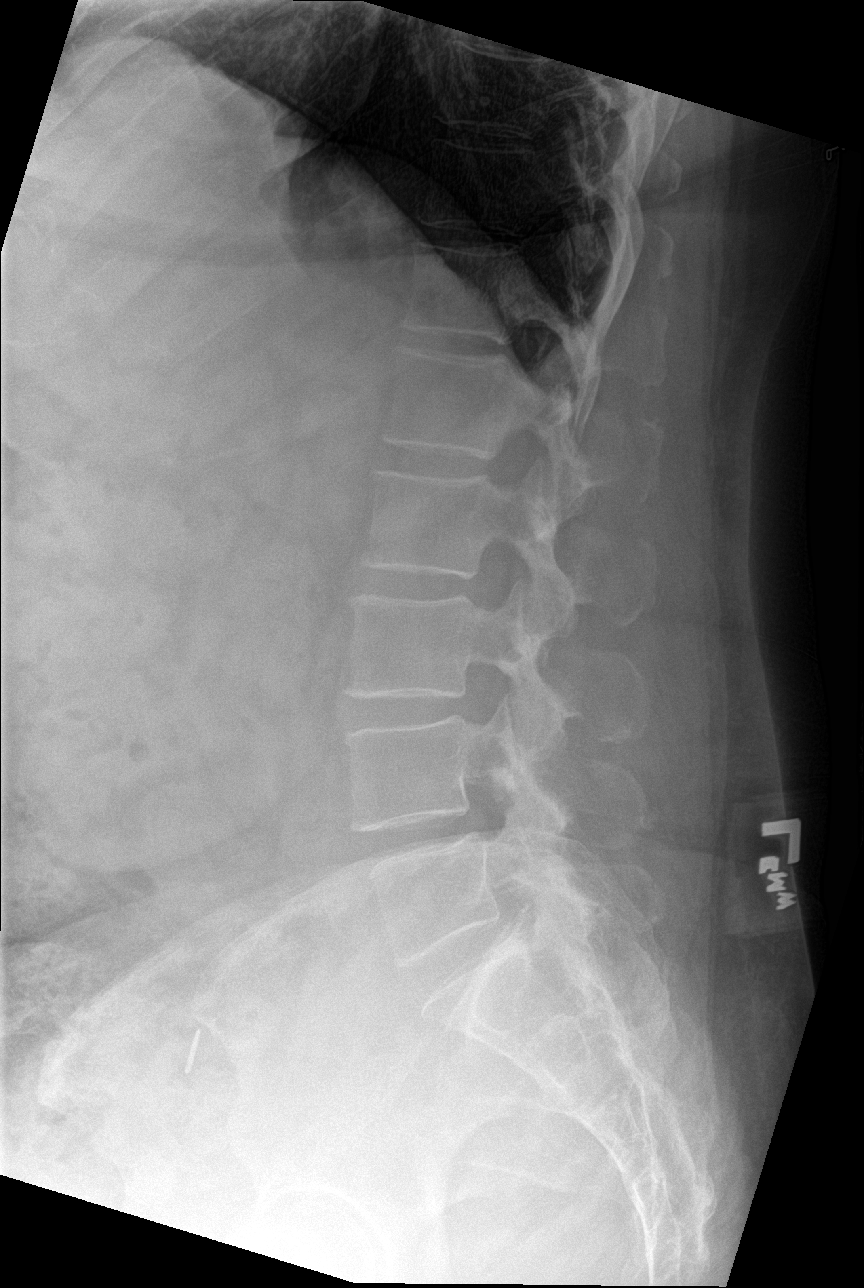

[l-spine spot]
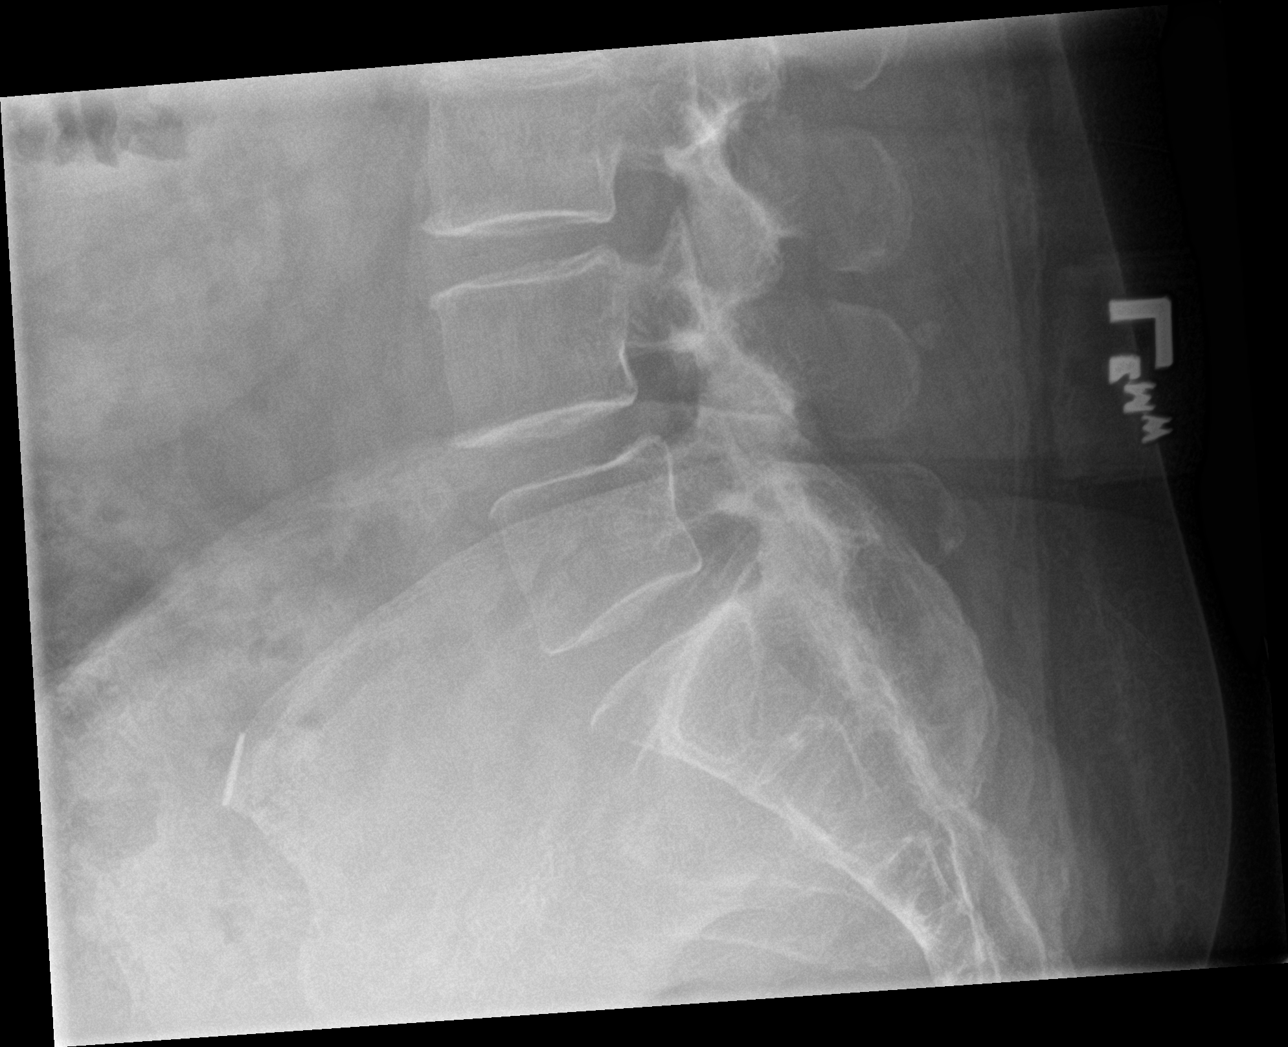

[5 of 5 positions shown; findings below may reference images not displayed]

FINDINGS: Five lumbar type vertebral bodies are well visualized. Vertebral
body height is well maintained. No pars defects are seen. Mild facet
hypertrophic changes are noted. Minimal anterolisthesis of L4 on L5
is noted. Mild disc space narrowing at L4-5 is noted as well. No
soft tissue abnormality is seen.
IMPRESSION: Mild degenerative change with minimal anterolisthesis of L4 on L5.

## 2022-03-18 ENCOUNTER — Encounter: Payer: Self-pay | Admitting: Family Medicine

## 2022-03-18 NOTE — Telephone Encounter (Signed)
Refill request was signed and placed in Alexandra Greer's box on Friday.

## 2022-03-29 DIAGNOSIS — Z8742 Personal history of other diseases of the female genital tract: Secondary | ICD-10-CM | POA: Diagnosis not present

## 2022-04-06 DIAGNOSIS — N3000 Acute cystitis without hematuria: Secondary | ICD-10-CM | POA: Diagnosis not present

## 2022-04-06 DIAGNOSIS — R300 Dysuria: Secondary | ICD-10-CM | POA: Diagnosis not present

## 2022-04-26 ENCOUNTER — Telehealth: Payer: Self-pay

## 2022-04-26 NOTE — Telephone Encounter (Signed)
Faxed refill request faxed back to pharmacy and a confirmation was received.

## 2022-05-03 ENCOUNTER — Telehealth: Payer: Self-pay

## 2022-05-03 NOTE — Telephone Encounter (Signed)
Compound semaglutide refill

## 2022-05-07 MED ORDER — AMBULATORY NON FORMULARY MEDICATION: 1 refills | Status: DC

## 2022-05-07 NOTE — Addendum Note (Signed)
Addended by: Mammie Lorenzo on: 05/07/2022 05:13 PM   Modules accepted: Orders

## 2022-05-14 ENCOUNTER — Encounter: Payer: Self-pay | Admitting: Family Medicine

## 2022-05-14 ENCOUNTER — Ambulatory Visit: Payer: BC Managed Care – PPO | Admitting: Family Medicine

## 2022-05-14 DIAGNOSIS — R635 Abnormal weight gain: Secondary | ICD-10-CM

## 2022-05-14 DIAGNOSIS — E039 Hypothyroidism, unspecified: Secondary | ICD-10-CM | POA: Diagnosis not present

## 2022-05-14 MED ORDER — AMBULATORY NON FORMULARY MEDICATION: 1 refills | Status: DC

## 2022-05-14 NOTE — Assessment & Plan Note (Signed)
Doing well with compounded semaglutide.  She has been off of this for a few weeks.  We will restart at 20 units (0.5 mg) then increase to 40 units (1 mg). Return in about 3 months (around 08/13/2022) for F/u weight/thyroid/fasting labs.

## 2022-05-14 NOTE — Assessment & Plan Note (Signed)
Continue levothyroxine at current strength.

## 2022-05-14 NOTE — Progress Notes (Signed)
Alexandra Greer - 51 y.o. female MRN [id number]  Date of birth: [birth year]  Subjective Chief Complaint  Patient presents with   Weight Management Screening    HPI Alexandra Greer is a 51 year old female here today for follow-up visit.  She reports overall she is doing well.  She has done quite well with compounded semaglutide.  Mild nausea initially.  Overall tolerating well.  She has been out of medication for a few weeks due to difficulty getting this from her pharmacy.  Her weight is down about 20 pounds since starting this.  Still feels pretty good with current dose of levothyroxine.  No symptoms related to hypo or hyperthyroidism.    ROS:  A comprehensive ROS was completed and negative except as noted per HPI    Allergies  Allergen Reactions   Nickel Itching, Rash and Hives   Topiramate     Past Medical History:  Diagnosis Date   Anxiety    Asthma    Colon polyps    Constipation    Depression    Hypothyroid    Joint pain    Low thyroid stimulating hormone (TSH) level     Past Surgical History:  Procedure Laterality Date   ABDOMINAL HYSTERECTOMY     BREAST SURGERY Left    lumpectomy    Social History   Socioeconomic History   Marital status: Married    Spouse name: Not on file   Number of children: Not on file   Years of education: Not on file   Highest education level: Not on file  Occupational History   Not on file  Tobacco Use   Smoking status: Some Days    Packs/day: 0.30    Years: 25.00    Total pack years: 7.50    Types: Cigarettes   Smokeless tobacco: Never  Vaping Use   Vaping Use: Never used  Substance and Sexual Activity   Alcohol use: Never   Drug use: Never   Sexual activity: Yes    Partners: Male  Other Topics Concern   Not on file  Social History Narrative   Not on file   Social Determinants of Health   Financial Resource Strain: Not on file  Food Insecurity: Not on file  Transportation Needs: Not on file  Physical Activity: Not  on file  Stress: Not on file  Social Connections: Not on file    Family History  Problem Relation Age of Onset   Hypertension Mother    Diabetes Mother    Hypertension Father     Health Maintenance  Topic Date Due   Zoster Vaccines- Shingrix (1 of 2) 08/13/2022 (Originally 10/26/1987)   INFLUENZA VACCINE  12/08/2022 (Originally 04/09/2022)   Hepatitis C Screening  05/15/2023 (Originally 10/25/1986)   HIV Screening  05/15/2023 (Originally 10/26/1983)   COVID-19 Vaccine (1) 05/31/2023 (Originally 10/25/1973)   MAMMOGRAM  11/15/2023   PAP SMEAR-Modifier  03/16/2024   COLONOSCOPY (Pts 45-13yrs Insurance coverage will need to be confirmed)  02/16/2025   TETANUS/TDAP  10/18/2029   HPV VACCINES  Aged Out     ----------------------------------------------------------------------------------------------------------------------------------------------------------------------------------------------------------------- Physical Exam BP 109/78 (BP Location: Left Arm, Patient Position: Sitting, Cuff Size: Large)   Pulse 80   Ht 5\' 3"  (1.6 m)   Wt 213 lb 1.3 oz (96.7 kg)   SpO2 98%   BMI 37.75 kg/m   Physical Exam Constitutional:      Appearance: Normal appearance.  Eyes:     General: No scleral icterus. Cardiovascular:     Rate  and Rhythm: Normal rate and regular rhythm.  Pulmonary:     Effort: Pulmonary effort is normal.     Breath sounds: Normal breath sounds.  Musculoskeletal:     Cervical back: Neck supple.  Neurological:     Mental Status: She is alert.  Psychiatric:        Mood and Affect: Mood normal.        Behavior: Behavior normal.     ------------------------------------------------------------------------------------------------------------------------------------------------------------------------------------------------------------------- Assessment and Plan  Abnormal weight gain Doing well with compounded semaglutide.  She has been off of this for a few weeks.   We will restart at 20 units (0.5 mg) then increase to 40 units (1 mg). Return in about 3 months (around 08/13/2022) for F/u weight/thyroid/fasting labs.   Hypothyroidism Continue levothyroxine at current strength.   Meds ordered this encounter  Medications   AMBULATORY NON FORMULARY MEDICATION    Sig: Semaglutide 2.5 mg/mL + Vit B6 10mg /mL.    Inject 10 units/0.25 mg subcutaneous weekly for 4 weeks then 20 units/0.5 mg subcutaneous weekly for 4 weeks, then 40 units/1 mg weekly. Dispense 55mL.    Dispense:  2 mL    Refill:  1    Return in about 3 months (around 08/13/2022) for F/u weight/thyroid/fasting labs.    This visit occurred during the SARS-CoV-2 public health emergency.  Safety protocols were in place, including screening questions prior to the visit, additional usage of staff PPE, and extensive cleaning of exam room while observing appropriate contact time as indicated for disinfecting solutions.

## 2022-07-23 DIAGNOSIS — E039 Hypothyroidism, unspecified: Secondary | ICD-10-CM | POA: Diagnosis not present

## 2022-08-13 ENCOUNTER — Ambulatory Visit: Payer: BC Managed Care – PPO | Admitting: Family Medicine

## 2022-08-13 ENCOUNTER — Encounter: Payer: Self-pay | Admitting: Family Medicine

## 2022-08-13 VITALS — BP 117/81 | HR 68 | Ht 63.0 in | Wt 211.0 lb

## 2022-08-13 DIAGNOSIS — Z13220 Encounter for screening for lipoid disorders: Secondary | ICD-10-CM | POA: Diagnosis not present

## 2022-08-13 DIAGNOSIS — R635 Abnormal weight gain: Secondary | ICD-10-CM | POA: Diagnosis not present

## 2022-08-13 DIAGNOSIS — E039 Hypothyroidism, unspecified: Secondary | ICD-10-CM

## 2022-08-13 DIAGNOSIS — M47816 Spondylosis without myelopathy or radiculopathy, lumbar region: Secondary | ICD-10-CM | POA: Diagnosis not present

## 2022-08-13 DIAGNOSIS — J4520 Mild intermittent asthma, uncomplicated: Secondary | ICD-10-CM | POA: Diagnosis not present

## 2022-08-13 DIAGNOSIS — M62838 Other muscle spasm: Secondary | ICD-10-CM

## 2022-08-13 MED ORDER — DIAZEPAM 2 MG PO TABS: 2.0000 mg | ORAL_TABLET | Freq: Two times a day (BID) | ORAL | 0 refills | Status: DC | PRN

## 2022-08-13 MED ORDER — AMBULATORY NON FORMULARY MEDICATION: 1 refills | Status: DC

## 2022-08-13 NOTE — Assessment & Plan Note (Signed)
Recent TSH normal.  Feels good with current strength of levothyroxine.

## 2022-08-13 NOTE — Progress Notes (Signed)
Alexandra Greer - 51 y.o. female MRN [id number]  Date of birth: 1971/03/17  Subjective Chief Complaint  Patient presents with   Neck Pain   Labs Only    HPI Alexandra Greer is a 51 year old female here today for follow-up visit.  She reports overall she is doing pretty well.  She has had stiffness in her neck for about a week and a half.  She has tried massage therapy, heat and anti-inflammatories without improvement.  She denies any radiation to the arm, numbness or tingling.  She continues to do well with compounded semaglutide.  Weight is down a couple pounds since last visit.  She does feel that this continues to help control her appetite.  She is not doing homework for exercise at this time.  Asthma remains well controlled with daily Symbicort.  She has not needed albuterol very often.  ROS:  A comprehensive ROS was completed and negative except as noted per HPI   Allergies  Allergen Reactions   Nickel Itching, Rash and Hives   Topiramate     Past Medical History:  Diagnosis Date   Anxiety    Asthma    Colon polyps    Constipation    Depression    Hypothyroid    Joint pain    Low thyroid stimulating hormone (TSH) level     Past Surgical History:  Procedure Laterality Date   ABDOMINAL HYSTERECTOMY     BREAST SURGERY Left    lumpectomy    Social History   Socioeconomic History   Marital status: Married    Spouse name: Not on file   Number of children: Not on file   Years of education: Not on file   Highest education level: Not on file  Occupational History   Not on file  Tobacco Use   Smoking status: Some Days    Packs/day: 0.30    Years: 25.00    Total pack years: 7.50    Types: Cigarettes   Smokeless tobacco: Never  Vaping Use   Vaping Use: Never used  Substance and Sexual Activity   Alcohol use: Never   Drug use: Never   Sexual activity: Yes    Partners: Male  Other Topics Concern   Not on file  Social History Narrative   Not on file   Social  Determinants of Health   Financial Resource Strain: Not on file  Food Insecurity: Not on file  Transportation Needs: Not on file  Physical Activity: Not on file  Stress: Not on file  Social Connections: Not on file    Family History  Problem Relation Age of Onset   Hypertension Mother    Diabetes Mother    Hypertension Father     Health Maintenance  Topic Date Due   Zoster Vaccines- Shingrix (1 of 2) 08/13/2022 (Originally 10/26/1987)   INFLUENZA VACCINE  12/08/2022 (Originally 04/09/2022)   Hepatitis C Screening  05/15/2023 (Originally 10/25/1986)   HIV Screening  05/15/2023 (Originally 10/26/1983)   COVID-19 Vaccine (1) 05/31/2023 (Originally 10/25/1973)   MAMMOGRAM  11/15/2023   PAP SMEAR-Modifier  03/16/2024   COLONOSCOPY (Pts 45-28yrs Insurance coverage will need to be confirmed)  02/16/2025   DTaP/Tdap/Td (2 - Td or Tdap) 10/18/2029   HPV VACCINES  Aged Out     ----------------------------------------------------------------------------------------------------------------------------------------------------------------------------------------------------------------- Physical Exam BP 117/81 (BP Location: Right Arm, Patient Position: Sitting, Cuff Size: Large)   Pulse 68   Ht 5\' 3"  (1.6 m)   Wt 211 lb (95.7 kg)   SpO2 99%  BMI 37.38 kg/m   Physical Exam Constitutional:      Appearance: Normal appearance.  HENT:     Head: Normocephalic and atraumatic.  Eyes:     General: No scleral icterus. Cardiovascular:     Rate and Rhythm: Normal rate and regular rhythm.  Pulmonary:     Effort: Pulmonary effort is normal.     Breath sounds: Normal breath sounds.  Musculoskeletal:     Cervical back: Neck supple.  Neurological:     Mental Status: She is alert.  Psychiatric:        Mood and Affect: Mood normal.        Behavior: Behavior normal.      ------------------------------------------------------------------------------------------------------------------------------------------------------------------------------------------------------------------- Assessment and Plan  Mild intermittent asthma without complication Doing well with daily Symbicort.  She has albuterol as needed as well.  Hypothyroidism Recent TSH normal.  Feels good with current strength of levothyroxine.  Abnormal weight gain Feeling pretty well last night with time.  Weight is down a couple of additional pound since last visit.  Encouraged to work on diet and incorporation of exercise over the next 2 months to maximize her weight loss.  Neck muscle spasm Adding low strength diazepam at 2 mg twice daily as needed.   Meds ordered this encounter  Medications   diazepam (VALIUM) 2 MG tablet    Sig: Take 1 tablet (2 mg total) by mouth every 12 (twelve) hours as needed for muscle spasms.    Dispense:  10 tablet    Refill:  0   AMBULATORY NON FORMULARY MEDICATION    Sig: Semaglutide 2.5 mg/mL + Vit B6 10mg /mL.    Inject 10 units/0.25 mg subcutaneous weekly for 4 weeks then 20 units/0.5 mg subcutaneous weekly for 4 weeks, then 40 units/1 mg weekly. Dispense 41mL.    Dispense:  2 mL    Refill:  1    No follow-ups on file.    This visit occurred during the SARS-CoV-2 public health emergency.  Safety protocols were in place, including screening questions prior to the visit, additional usage of staff PPE, and extensive cleaning of exam room while observing appropriate contact time as indicated for disinfecting solutions.

## 2022-08-13 NOTE — Assessment & Plan Note (Signed)
Adding low strength diazepam at 2 mg twice daily as needed.

## 2022-08-13 NOTE — Patient Instructions (Signed)
Try diazepam 2mg  twice daily for tightness/spasm in neck.  Let me know if not improving with this.   See me again in 6 months.

## 2022-08-13 NOTE — Assessment & Plan Note (Signed)
Feeling pretty well last night with time.  Weight is down a couple of additional pound since last visit.  Encouraged to work on diet and incorporation of exercise over the next 2 months to maximize her weight loss.

## 2022-08-13 NOTE — Assessment & Plan Note (Signed)
Doing well with daily Symbicort.  She has albuterol as needed as well.

## 2022-08-26 DIAGNOSIS — Z13220 Encounter for screening for lipoid disorders: Secondary | ICD-10-CM | POA: Diagnosis not present

## 2022-08-26 DIAGNOSIS — R635 Abnormal weight gain: Secondary | ICD-10-CM | POA: Diagnosis not present

## 2022-08-27 LAB — COMPLETE METABOLIC PANEL WITH GFR
AG Ratio: 2 (calc) (ref 1.0–2.5)
ALT: 35 U/L — ABNORMAL HIGH (ref 6–29)
AST: 24 U/L (ref 10–35)
Albumin: 4.6 g/dL (ref 3.6–5.1)
Alkaline phosphatase (APISO): 54 U/L (ref 37–153)
BUN: 11 mg/dL (ref 7–25)
CO2: 29 mmol/L (ref 20–32)
Calcium: 9.6 mg/dL (ref 8.6–10.4)
Chloride: 103 mmol/L (ref 98–110)
Creat: 1 mg/dL (ref 0.50–1.03)
Globulin: 2.3 g/dL (calc) (ref 1.9–3.7)
Glucose, Bld: 87 mg/dL (ref 65–99)
Potassium: 4.2 mmol/L (ref 3.5–5.3)
Sodium: 140 mmol/L (ref 135–146)
Total Bilirubin: 0.5 mg/dL (ref 0.2–1.2)
Total Protein: 6.9 g/dL (ref 6.1–8.1)
eGFR: 67 mL/min/{1.73_m2} (ref >=60)

## 2022-08-27 LAB — LIPID PANEL W/REFLEX DIRECT LDL
Cholesterol: 232 mg/dL — ABNORMAL HIGH (ref <200)
HDL: 50 mg/dL (ref >=50)
LDL Cholesterol (Calc): 149 mg/dL (calc) — ABNORMAL HIGH
Non-HDL Cholesterol (Calc): 182 mg/dL (calc) — ABNORMAL HIGH (ref <130)
Total CHOL/HDL Ratio: 4.6 (calc) (ref <5.0)
Triglycerides: 191 mg/dL — ABNORMAL HIGH (ref <150)

## 2022-08-27 LAB — CBC WITH DIFFERENTIAL/PLATELET
Absolute Monocytes: 540 cells/uL (ref 200–950)
Basophils Absolute: 99 cells/uL (ref 0–200)
Basophils Relative: 1.4 %
Eosinophils Absolute: 575 cells/uL — ABNORMAL HIGH (ref 15–500)
Eosinophils Relative: 8.1 %
HCT: 44.7 % (ref 35.0–45.0)
Hemoglobin: 15.5 g/dL (ref 11.7–15.5)
Lymphs Abs: 1889 cells/uL (ref 850–3900)
MCH: 31.3 pg (ref 27.0–33.0)
MCHC: 34.7 g/dL (ref 32.0–36.0)
MCV: 90.3 fL (ref 80.0–100.0)
MPV: 10.4 fL (ref 7.5–12.5)
Monocytes Relative: 7.6 %
Neutro Abs: 3997 cells/uL (ref 1500–7800)
Neutrophils Relative %: 56.3 %
Platelets: 309 10*3/uL (ref 140–400)
RBC: 4.95 10*6/uL (ref 3.80–5.10)
RDW: 12.1 % (ref 11.0–15.0)
Total Lymphocyte: 26.6 %
WBC: 7.1 10*3/uL (ref 3.8–10.8)

## 2022-08-27 LAB — HEMOGLOBIN A1C
Hgb A1c MFr Bld: 5.1 % of total Hgb (ref <5.7)
Mean Plasma Glucose: 100 mg/dL
eAG (mmol/L): 5.5 mmol/L

## 2022-09-03 ENCOUNTER — Encounter: Payer: Self-pay | Admitting: Medical-Surgical

## 2022-09-03 ENCOUNTER — Telehealth (INDEPENDENT_AMBULATORY_CARE_PROVIDER_SITE_OTHER): Payer: BC Managed Care – PPO | Admitting: Medical-Surgical

## 2022-09-03 DIAGNOSIS — F419 Anxiety disorder, unspecified: Secondary | ICD-10-CM | POA: Diagnosis not present

## 2022-09-03 MED ORDER — HYDROXYZINE HCL 25 MG PO TABS: 12.5000 mg | ORAL_TABLET | Freq: Three times a day (TID) | ORAL | 1 refills | Status: DC | PRN

## 2022-09-03 NOTE — Progress Notes (Signed)
Virtual Visit via Video Note  I connected with Alexandra Greer on 09/03/22 at  4:00 PM EST by a video enabled telemedicine application and verified that I am speaking with the correct person using two identifiers.   I discussed the limitations of evaluation and management by telemedicine and the availability of in person appointments. The patient expressed understanding and agreed to proceed.  Patient location: home Provider locations: office  Subjective:    CC: anxiety  HPI: Pleasant 51 year old female presenting via MyChart video visit with reports of severe anxiety and stress with recent life events. She reports that her mother has been ill and she has had to take her to two different hospitals this week. Her mom was discharged from the ED twice with no clear answers and this is very distressing. Also notes that her mother-in-law passed away last night and that her 3 grandchildren have RSV/flu. Currently taking Wellbutrin 150mg  daily, tolerating well without side effects but it isn't helping much with the new stress. Was treated with Xanax in the past for several years but managed to come off this in 2018. Right now, is interested in a medication to help take the edge off of her anxiety. Symptoms worse at night and not sleeping well since she can't shut her mind off.    Past medical history, Surgical history, Family history not pertinant except as noted below, Social history, Allergies, and medications have been entered into the medical record, reviewed, and corrections made.   Review of Systems: See HPI for pertinent positives and negatives.   Objective:    General: Speaking clearly in complete sentences without any shortness of breath.  Alert and oriented x3.  Normal judgment. No apparent acute distress.  Impression and Recommendations:    1. Anxiety Continue Wellbutrin 150mg  daily. Reviewed options for acute anxiety. With a history of chronic benzo use, would like to avoid adding  that back just yet. Discussed using BuSpar vs Hydroxyzine. Since her biggest trouble is getting to sleep at night, starting with Hydroxyzine. Recommend taking 1/2 tablet in the morning as needed for anxiety on waking. Ok to use up to 50mg  at bedtime as needed.    I discussed the assessment and treatment plan with the patient. The patient was provided an opportunity to ask questions and all were answered. The patient agreed with the plan and demonstrated an understanding of the instructions.   The patient was advised to call back or seek an in-person evaluation if the symptoms worsen or if the condition fails to improve as anticipated.  25 minutes of non-face-to-face time was provided during this encounter.  Return if symptoms worsen or fail to improve.  2019, DNP, APRN, FNP-BC [facility]

## 2022-11-08 ENCOUNTER — Ambulatory Visit
Admission: EM | Admit: 2022-11-08 | Discharge: 2022-11-08 | Disposition: A | Discharge status: Home / Self Care | Payer: BC Managed Care – PPO

## 2022-11-08 DIAGNOSIS — J309 Allergic rhinitis, unspecified: Secondary | ICD-10-CM

## 2022-11-08 DIAGNOSIS — J4521 Mild intermittent asthma with (acute) exacerbation: Secondary | ICD-10-CM

## 2022-11-08 MED ORDER — METHYLPREDNISOLONE ACETATE 80 MG/ML IJ SUSP
80.0000 mg | Freq: Once | INTRAMUSCULAR | Status: AC
  Administered 2022-11-08: 80 mg via INTRAMUSCULAR

## 2022-11-08 MED ORDER — CETIRIZINE HCL 10 MG PO TABS: 10.0000 mg | ORAL_TABLET | Freq: Every day | ORAL | 1 refills | Status: DC

## 2022-11-08 MED ORDER — AZELASTINE-FLUTICASONE 137-50 MCG/ACT NA SUSP: 1.0000 | Freq: Two times a day (BID) | NASAL | 2 refills | Status: DC

## 2022-11-08 NOTE — Discharge Instructions (Signed)
Your symptoms and my physical exam findings are concerning for exacerbation of your underlying allergies.     Please read below to learn more about the medications, dosages and frequencies that I recommend to help alleviate your symptoms and to get you feeling better soon: Deco-Medrol IM (methylprednisolone):  To quickly address your significant respiratory inflammation, you were provided with an injection of Solu-Medrol in the office today.  You should continue to feel the full benefit of the steroid for the next 4 to 6 hours.    Zyrtec (cetirizine): This is an excellent second-generation antihistamine that helps to reduce respiratory inflammatory response to environmental allergens.  In some patients, this medication can cause daytime sleepiness so I recommend that you take 1 tablet daily at bedtime.     Dymista (azelastine-fluticasone): This is a steroid and antihistamine nasal spray that used twice daily, 1 spray in each nare.  This works best when used on a daily basis. This medication does not work well if it is only used when you think you need it.  After 3 to 5 days of use, you will notice significant reduction of the inflammation and mucus production that is currently being caused by exposure to allergens, whether seasonal or environmental.  The most common side effect of this medication is nosebleeds.  If you experience a nosebleed, please discontinue use for 1 week, then feel free to resume.  If you find that your insurance will not pay for this medication, please consider a different nasal steroids such as Nasonex (mometasone), or Nasacort (triamcinolone).  ProAir, Ventolin, Proventil (albuterol): This inhaled medication contains a short acting beta agonist bronchodilator.  This medication relaxes the smooth muscle of the airway in the lungs.  When these muscles are tight, breathing becomes more constricted.  The result of relaxation of the smooth muscle is increased air movement and improved  work of breathing.  This is a short acting medication that can be used every 4-6 hours as needed for increased work of breathing, shortness of breath, wheezing and excessive coughing.  It comes in the form of a handheld inhaler or nebulizer solution.  I recommended that for the next 3 to 4 days, this medication is used 4 times daily on a scheduled basis then decrease to twice daily and as needed until symptoms have completely resolved which I anticipate will be several weeks.   If symptoms have not meaningfully improved in the next 5 to 7 days, please return for repeat evaluation or follow-up with your regular provider.  If symptoms have worsened in the next 3 to 5 days, please return for repeat evaluation or follow-up with your regular provider.    Thank you for visiting urgent care today.  We appreciate the opportunity to participate in your care.

## 2022-11-08 NOTE — ED Triage Notes (Signed)
[facility] to [facility] with [facility] of chest tightness with cough and sinus drainage. Pt reports symptoms started earlier this week. Pt reports otc nyquill .

## 2022-11-08 NOTE — ED Provider Notes (Signed)
[name]    CSN: [id number] Arrival date & time: 11/08/22  0857    HISTORY   Chief Complaint  Patient presents with   URI   HPI Alexandra Greer is a pleasant, 52 y.o. female who presents to urgent care today. Patient complains of chest tightness with nonproductive cough and sinus congestion and drainage.  Patient reports a history of allergies and mild asthma, states she used her albuterol inhaler 3 times yesterday, states this helped.  Patient states she has also been taking NyQuil which has not helped much.  Patient states her husband has had similar symptoms but has not been medically evaluated or treated.  Patient states he is not currently taking any allergy medications.  Patient denies fever, body aches, chills, sore throat, nausea, vomiting, diarrhea, known sick contacts otherwise.  The history is provided by the patient.   Past Medical History:  Diagnosis Date   Anxiety    Asthma    Colon polyps    Constipation    Depression    Hypothyroid    Joint pain    Low thyroid stimulating hormone (TSH) level    Patient Active Problem List   Diagnosis Date Noted   Neck muscle spasm 08/13/2022   Abnormal weight gain 09/13/2021   Class 3 severe obesity due to excess calories without serious comorbidity with body mass index (BMI) of 40.0 to 44.9 in adult [facility]) 09/13/2021   Anxiety 07/10/2021   Family history of colon cancer in father 02/10/2020   H/O: hysterectomy 02/10/2020   Former smoker 02/10/2020   Lumbar spondylosis 02/10/2020   Disc disease, degenerative, cervical 02/10/2020   Mild intermittent asthma without complication [id number]   Mild obstructive sleep apnea 10/28/2018   Subcutaneous nodule of breast 06/20/2016   Hypothyroidism 09/12/2011   Past Surgical History:  Procedure Laterality Date   ABDOMINAL HYSTERECTOMY     BREAST SURGERY Left    lumpectomy   OB History   No obstetric history on file.    Home Medications    Prior to Admission  medications   Medication Sig Start Date End Date Taking? Authorizing Provider  Azelastine-Fluticasone (DYMISTA) 137-50 MCG/ACT SUSP Place 1 spray into the nose every 12 (twelve) hours. 11/08/22 12/08/22 Yes Lynden Oxford Scales, PA-C  cetirizine (ZYRTEC ALLERGY) 10 MG tablet Take 1 tablet (10 mg total) by mouth at bedtime. 11/08/22 05/07/23 Yes Lynden Oxford Scales, PA-C  Semaglutide, 1 MG/DOSE, (OZEMPIC, 1 MG/DOSE,) 4 MG/3ML SOPN Inject 0.25 mg into the skin daily. 11/08/21  Yes [provider]  albuterol (VENTOLIN HFA) 108 (90 Base) MCG/ACT inhaler SMARTSIG:2 Puff(s) By Mouth Every 4 Hours PRN 06/25/22   [provider]  AMBULATORY NON FORMULARY MEDICATION Semaglutide 2.5 mg/mL + Vit B6 '10mg'$ /mL.    Inject 10 units/0.25 mg subcutaneous weekly for 4 weeks then 20 units/0.5 mg subcutaneous weekly for 4 weeks, then 40 units/1 mg weekly. Dispense 32m. 08/13/22   MLuetta Nutting DO  budesonide-formoterol (SYMBICORT) 160-4.5 MCG/ACT inhaler Inhale 2 puffs into the lungs 2 (two) times daily. 05/14/22   [provider]  buPROPion (WELLBUTRIN XL) 150 MG 24 hr tablet Take 150 mg by mouth daily. 07/15/21   [provider]  hydrOXYzine (ATARAX) 25 MG tablet Take 0.5-2 tablets (12.5-50 mg total) by mouth every 8 (eight) hours as needed. 09/03/22   JSamuel Bouche NP  levothyroxine (SYNTHROID) 100 MCG tablet Take 1 tablet (100 mcg total) by mouth daily before breakfast. 02/10/20   AEmeterio Reeve DO  nystatin (MYCOSTATIN/NYSTOP) powder  APPLY TO AFFECTED AREA TWICE A DAY 10/01/21   [provider]  budesonide (PULMICORT) 0.5 MG/2ML nebulizer solution Take 2 mLs (0.5 mg total) by nebulization 2 (two) times daily as needed. 03/14/20 12/03/20  Orma Render, NP    Family History Family History  Problem Relation Age of Onset   Hypertension Mother    Diabetes Mother    Hypertension Father    Social History Social History   Tobacco Use   Smoking status: Some Days     Packs/day: 0.30    Years: 25.00    Total pack years: 7.50    Types: Cigarettes   Smokeless tobacco: Never  Vaping Use   Vaping Use: Never used  Substance Use Topics   Alcohol use: Never   Drug use: Never   Allergies   Nickel and Topiramate  Review of Systems Review of Systems Pertinent findings revealed after performing a 14 point review of systems has been noted in the history of present illness.  Physical Exam Vital Signs BP (!) 142/83 (BP Location: Right Arm)   Pulse 77   Temp 98.4 F (36.9 C) (Oral)   Resp 15   SpO2 96%   No data found.  Physical Exam Vitals and nursing note reviewed.  Constitutional:      General: She is not in acute distress.    Appearance: Normal appearance. She is not ill-appearing.  HENT:     Head: Normocephalic and atraumatic.     Salivary Glands: Right salivary gland is not diffusely enlarged or tender. Left salivary gland is not diffusely enlarged or tender.     Right Ear: Ear canal and external ear normal. No drainage. A middle ear effusion is present. There is no impacted cerumen. Tympanic membrane is bulging. Tympanic membrane is not injected or erythematous.     Left Ear: Ear canal and external ear normal. No drainage. A middle ear effusion is present. There is no impacted cerumen. Tympanic membrane is bulging. Tympanic membrane is not injected or erythematous.     Ears:     Comments: Bilateral EACs normal, both TMs bulging with clear fluid    Nose: Rhinorrhea present. No nasal deformity, septal deviation, signs of injury, nasal tenderness, mucosal edema or congestion. Rhinorrhea is clear.     Right Nostril: Occlusion present. No foreign body, epistaxis or septal hematoma.     Left Nostril: Occlusion present. No foreign body, epistaxis or septal hematoma.     Right Turbinates: Enlarged, swollen and pale.     Left Turbinates: Enlarged, swollen and pale.     Right Sinus: No maxillary sinus tenderness or frontal sinus tenderness.     Left  Sinus: No maxillary sinus tenderness or frontal sinus tenderness.     Mouth/Throat:     Lips: Pink. No lesions.     Mouth: Mucous membranes are moist. No oral lesions.     Pharynx: Oropharynx is clear. Uvula midline. No posterior oropharyngeal erythema or uvula swelling.     Tonsils: No tonsillar exudate. 0 on the right. 0 on the left.     Comments: Postnasal drip Eyes:     General: Lids are normal.        Right eye: No discharge.        Left eye: No discharge.     Extraocular Movements: Extraocular movements intact.     Conjunctiva/sclera: Conjunctivae normal.     Right eye: Right conjunctiva is not injected.     Left eye: Left conjunctiva is not  injected.  Neck:     Trachea: Trachea and phonation normal.  Cardiovascular:     Rate and Rhythm: Normal rate and regular rhythm.     Pulses: Normal pulses.     Heart sounds: Normal heart sounds. No murmur heard.    No friction rub. No gallop.  Pulmonary:     Effort: Pulmonary effort is normal. No accessory muscle usage, prolonged expiration or respiratory distress.     Breath sounds: No stridor, decreased air movement or transmitted upper airway sounds. Examination of the right-upper field reveals wheezing. Wheezing present. No decreased breath sounds, rhonchi or rales.  Chest:     Chest wall: No tenderness.  Musculoskeletal:        General: Normal range of motion.     Cervical back: Normal range of motion and neck supple. Normal range of motion.  Lymphadenopathy:     Cervical: No cervical adenopathy.  Skin:    General: Skin is warm and dry.     Findings: No erythema or rash.  Neurological:     General: No focal deficit present.     Mental Status: She is alert and oriented to person, place, and time.  Psychiatric:        Mood and Affect: Mood normal.        Behavior: Behavior normal.     Visual Acuity Right Eye Distance:   Left Eye Distance:   Bilateral Distance:    Right Eye Near:   Left Eye Near:    Bilateral Near:      UC Couse / Diagnostics / Procedures:     Radiology No results found.  Procedures Procedures (including critical care time) EKG  Pending results:  Labs Reviewed - No data to display  Medications Ordered in UC: Medications  methylPREDNISolone acetate (DEPO-MEDROL) injection 80 mg (80 mg Intramuscular Given 11/08/22 0940)    UC Diagnoses / Final Clinical Impressions(s)   I have reviewed the triage vital signs and the nursing notes.  Pertinent labs & imaging results that were available during my care of the patient were reviewed by me and considered in my medical decision making (see chart for details).    Final diagnoses:  Allergic rhinitis, unspecified seasonality, unspecified trigger  Mild intermittent asthma with acute exacerbation  Patient provided with an injection of Depo-Medrol during her visit today to resolve wheezing and upper respiratory tract on the right.  Patient advised to use albuterol 4 times daily 2 puffs each time on a scheduled basis for the next several days then decrease to as needed.  Patient provided with prescription for Zyrtec and Dymista nasal spray for allergy symptoms.  Conservative care recommended, return precautions advised.  Please see discharge instructions below for further details of plan of care as provided to patient. ED Prescriptions     Medication Sig Dispense Auth. Provider   cetirizine (ZYRTEC ALLERGY) 10 MG tablet Take 1 tablet (10 mg total) by mouth at bedtime. 90 tablet Lynden Oxford Scales, PA-C   Azelastine-Fluticasone [facility]) 137-50 MCG/ACT SUSP Place 1 spray into the nose every 12 (twelve) hours. 23 g Lynden Oxford Scales, PA-C      PDMP not reviewed this encounter.  Disposition Upon Discharge:  Condition: stable for discharge home Home: take medications as prescribed; routine discharge instructions as discussed; follow up as advised.  Patient presented with an acute illness with associated systemic symptoms and  significant discomfort requiring urgent management. In my opinion, this is a condition that a prudent lay person (someone who  possesses an average knowledge of health and medicine) may potentially expect to result in complications if not addressed urgently such as respiratory distress, impairment of bodily function or dysfunction of bodily organs.   Routine symptom specific, illness specific and/or disease specific instructions were discussed with the patient and/or caregiver at length.   As such, the patient has been evaluated and assessed, work-up was performed and treatment was provided in alignment with urgent care protocols and evidence based medicine.  Patient/parent/caregiver has been advised that the patient may require follow up for further testing and treatment if the symptoms continue in spite of treatment, as clinically indicated and appropriate.  If the patient was tested for COVID-19, Influenza and/or RSV, then the patient/parent/guardian was advised to isolate at home pending the results of his/her diagnostic coronavirus test and potentially longer if they're positive. I have also advised pt that if his/her COVID-19 test returns positive, it's recommended to self-isolate for at least 10 days after symptoms first appeared AND until fever-free for 24 hours without fever reducer AND other symptoms have improved or resolved. Discussed self-isolation recommendations as well as instructions for household member/close contacts as per the [facility] and [place] DHHS, and also gave patient the [place] packet with this information.  Patient/parent/caregiver has been advised to return to the [facility] or PCP in 3-5 days if no better; to PCP or the Emergency Department if new signs and symptoms develop, or if the current signs or symptoms continue to change or worsen for further workup, evaluation and treatment as clinically indicated and appropriate  The patient will follow up with their current PCP if and as advised. If  the patient does not currently have a PCP we will assist them in obtaining one.   The patient may need specialty follow up if the symptoms continue, in spite of conservative treatment and management, for further workup, evaluation, consultation and treatment as clinically indicated and appropriate.  Patient/parent/caregiver verbalized understanding and agreement of plan as discussed.  All questions were addressed during visit.  Please see discharge instructions below for further details of plan.  Discharge Instructions:   Discharge Instructions      Your symptoms and my physical exam findings are concerning for exacerbation of your underlying allergies.     Please read below to learn more about the medications, dosages and frequencies that I recommend to help alleviate your symptoms and to get you feeling better soon: Deco-Medrol IM (methylprednisolone):  To quickly address your significant respiratory inflammation, you were provided with an injection of Solu-Medrol in the office today.  You should continue to feel the full benefit of the steroid for the next 4 to 6 hours.    Zyrtec (cetirizine): This is an excellent second-generation antihistamine that helps to reduce respiratory inflammatory response to environmental allergens.  In some patients, this medication can cause daytime sleepiness so I recommend that you take 1 tablet daily at bedtime.     Dymista (azelastine-fluticasone): This is a steroid and antihistamine nasal spray that used twice daily, 1 spray in each nare.  This works best when used on a daily basis. This medication does not work well if it is only used when you think you need it.  After 3 to 5 days of use, you will notice significant reduction of the inflammation and mucus production that is currently being caused by exposure to allergens, whether seasonal or environmental.  The most common side effect of this medication is nosebleeds.  If you experience a nosebleed, please  discontinue use for 1 week, then feel free to resume.  If you find that your insurance will not pay for this medication, please consider a different nasal steroids such as Nasonex (mometasone), or Nasacort (triamcinolone).  ProAir, Ventolin, Proventil (albuterol): This inhaled medication contains a short acting beta agonist bronchodilator.  This medication relaxes the smooth muscle of the airway in the lungs.  When these muscles are tight, breathing becomes more constricted.  The result of relaxation of the smooth muscle is increased air movement and improved work of breathing.  This is a short acting medication that can be used every 4-6 hours as needed for increased work of breathing, shortness of breath, wheezing and excessive coughing.  It comes in the form of a handheld inhaler or nebulizer solution.  I recommended that for the next 3 to 4 days, this medication is used 4 times daily on a scheduled basis then decrease to twice daily and as needed until symptoms have completely resolved which I anticipate will be several weeks.   If symptoms have not meaningfully improved in the next 5 to 7 days, please return for repeat evaluation or follow-up with your regular provider.  If symptoms have worsened in the next 3 to 5 days, please return for repeat evaluation or follow-up with your regular provider.    Thank you for visiting urgent care today.  We appreciate the opportunity to participate in your care.     This office note has been dictated using Museum/gallery curator.  Unfortunately, this method of dictation can sometimes lead to typographical or grammatical errors.  I apologize for your inconvenience in advance if this occurs.  Please do not hesitate to reach out to me if clarification is needed.      Lynden Oxford Scales, [place] 11/08/22 [phone]

## 2022-11-18 DIAGNOSIS — Z1231 Encounter for screening mammogram for malignant neoplasm of breast: Secondary | ICD-10-CM | POA: Diagnosis not present

## 2022-11-18 LAB — HM MAMMOGRAPHY

## 2023-01-24 DIAGNOSIS — L988 Other specified disorders of the skin and subcutaneous tissue: Secondary | ICD-10-CM | POA: Diagnosis not present

## 2023-01-24 DIAGNOSIS — L814 Other melanin hyperpigmentation: Secondary | ICD-10-CM | POA: Diagnosis not present

## 2023-01-24 DIAGNOSIS — L304 Erythema intertrigo: Secondary | ICD-10-CM | POA: Diagnosis not present

## 2023-01-24 DIAGNOSIS — L732 Hidradenitis suppurativa: Secondary | ICD-10-CM | POA: Diagnosis not present

## 2023-02-12 ENCOUNTER — Ambulatory Visit: Payer: BC Managed Care – PPO | Admitting: Family Medicine

## 2023-02-12 ENCOUNTER — Encounter: Payer: Self-pay | Admitting: Family Medicine

## 2023-02-12 VITALS — BP 132/89 | HR 77 | Ht 63.0 in | Wt 220.0 lb

## 2023-02-12 DIAGNOSIS — R635 Abnormal weight gain: Secondary | ICD-10-CM | POA: Diagnosis not present

## 2023-02-12 DIAGNOSIS — F4321 Adjustment disorder with depressed mood: Secondary | ICD-10-CM | POA: Diagnosis not present

## 2023-02-12 DIAGNOSIS — E039 Hypothyroidism, unspecified: Secondary | ICD-10-CM

## 2023-02-12 DIAGNOSIS — M47816 Spondylosis without myelopathy or radiculopathy, lumbar region: Secondary | ICD-10-CM

## 2023-02-12 MED ORDER — AMBULATORY NON FORMULARY MEDICATION: 1 refills | Status: DC

## 2023-02-12 MED ORDER — DICLOFENAC SODIUM 75 MG PO TBEC: 75.0000 mg | DELAYED_RELEASE_TABLET | Freq: Two times a day (BID) | ORAL | 0 refills | Status: DC | PRN

## 2023-02-12 MED ORDER — ALPRAZOLAM 0.25 MG PO TABS: 0.1250 mg | ORAL_TABLET | Freq: Every evening | ORAL | 3 refills | Status: DC | PRN

## 2023-02-12 NOTE — Progress Notes (Signed)
Alexandra Greer - 52 y.o. female MRN [id number]  Date of birth: Jan 30, 1971  Subjective Chief Complaint  Patient presents with   Follow-up   Grief    HPI Alexandra Greer is a 52 y.o. female here today for follow up visit.    Unfortunately, her mother passed away in 12-Nov-2022.  She continues to grieve.  Remains on bupropion.  Has felt more anxious with difficulty sleeping. Tried vistaril but this didn't seem to help very much.  She has tried alprazolam for short periods of time in the past.    She has been using compounded semaglutide to help with weight management. She has done pretty well with this.  She has been off of this for a couple of months . Weight has increased some since being   Thyroid is managed by endocrinology.  On stable strength of levothyroxine.   ROS:  A comprehensive ROS was completed and negative except as noted per HPI  Allergies  Allergen Reactions   Nickel Itching, Rash and Hives   Topiramate     Past Medical History:  Diagnosis Date   Anxiety    Asthma    Colon polyps    Constipation    Depression    Hypothyroid    Joint pain    Low thyroid stimulating hormone (TSH) level     Past Surgical History:  Procedure Laterality Date   ABDOMINAL HYSTERECTOMY     BREAST SURGERY Left    lumpectomy    Social History   Socioeconomic History   Marital status: Married    Spouse name: Not on file   Number of children: Not on file   Years of education: Not on file   Highest education level: 12th grade  Occupational History   Not on file  Tobacco Use   Smoking status: Some Days    Packs/day: 0.30    Years: 25.00    Additional pack years: 0.00    Total pack years: 7.50    Types: Cigarettes   Smokeless tobacco: Never  Vaping Use   Vaping Use: Never used  Substance and Sexual Activity   Alcohol use: Never   Drug use: Never   Sexual activity: Yes    Partners: Male  Other Topics Concern   Not on file  Social History Narrative   Not on file    Social Determinants of Health   Financial Resource Strain: Patient Declined (02/11/2023)   Overall Financial Resource Strain (CARDIA)    Difficulty of Paying Living Expenses: Patient declined  Food Insecurity: Patient Declined (02/11/2023)   Hunger Vital Sign    Worried About Running Out of Food in the Last Year: Patient declined    Ran Out of Food in the Last Year: Patient declined  Transportation Needs: No Transportation Needs (02/11/2023)   PRAPARE - Administrator, Civil Service (Medical): No    Lack of Transportation (Non-Medical): No  Physical Activity: Unknown (02/11/2023)   Exercise Vital Sign    Days of Exercise per Week: Patient declined    Minutes of Exercise per Session: Not on file  Stress: Patient Declined (02/11/2023)   [employer] of Occupational Health - Occupational Stress Questionnaire    Feeling of Stress : Patient declined  Social Connections: Unknown (02/11/2023)   Social Connection and Isolation Panel [NHANES]    Frequency of Communication with Friends and Family: Patient declined    Frequency of Social Gatherings with Friends and Family: Patient declined    Attends Religious Services: Patient  declined    Active Member of Clubs or Organizations: No    Attends Banker Meetings: Not on file    Marital Status: Married    Family History  Problem Relation Age of Onset   Hypertension Mother    Diabetes Mother    Hypertension Father     Health Maintenance  Topic Date Due   Hepatitis C Screening  05/15/2023 (Originally 10/25/1986)   HIV Screening  05/15/2023 (Originally 10/26/1983)   COVID-19 Vaccine (1) 05/31/2023 (Originally 10/25/1973)   Zoster Vaccines- Shingrix (1 of 2) 08/14/2023 (Originally 10/26/1987)   INFLUENZA VACCINE  04/10/2023   MAMMOGRAM  11/15/2023   PAP SMEAR-Modifier  03/16/2024   Colonoscopy  02/16/2025   DTaP/Tdap/Td (2 - Td or Tdap) 10/18/2029   HPV VACCINES  Aged Out      ----------------------------------------------------------------------------------------------------------------------------------------------------------------------------------------------------------------- Physical Exam BP 132/89 (BP Location: Left Arm, Patient Position: Sitting, Cuff Size: Large)   Pulse 77   Ht 5\' 3"  (1.6 m)   Wt 220 lb (99.8 kg)   SpO2 99%   BMI 38.97 kg/m   Physical Exam Constitutional:      Appearance: Normal appearance.  HENT:     Head: Normocephalic and atraumatic.  Eyes:     General: No scleral icterus. Musculoskeletal:     Cervical back: Neck supple.  Neurological:     Mental Status: She is alert.  Psychiatric:        Mood and Affect: Mood normal.        Behavior: Behavior normal.     Comments: Appropriately tearful when discussing her mother.      ------------------------------------------------------------------------------------------------------------------------------------------------------------------------------------------------------------------- Assessment and Plan  Grief She continues to have grief related to the loss of her mother.  Significant difficulty with sleep.  Short term alprazolam as needed at bedtime.  Continue bupropion at current strength.    Abnormal weight gain Continue with semaglutide.  Updated rx sent to [facility].   Lumbar spondylosis Adding diclofenac as needed.   Hypothyroidism Management per endocrinology.  Stable at this time.    Meds ordered this encounter  Medications   AMBULATORY NON FORMULARY MEDICATION    Sig: Semaglutide 2.5 mg/mL + Vit B6 10mg /mL.    Inject 10 units/0.25 mg subcutaneous weekly for 4 weeks then 20 units/0.5 mg subcutaneous weekly for 4 weeks, then 40 units/1 mg weekly. Dispense 2mL.    Dispense:  2 mL    Refill:  1   diclofenac (VOLTAREN) 75 MG EC tablet    Sig: Take 1 tablet (75 mg total) by mouth 2 (two) times daily as needed.    Dispense:  30 tablet    Refill:   0   ALPRAZolam (XANAX) 0.25 MG tablet    Sig: Take 0.5-1 tablets (0.125-0.25 mg total) by mouth at bedtime as needed for anxiety.    Dispense:  20 tablet    Refill:  3    Return in about 3 months (around 05/15/2023) for f/u Weight mgmt . [name]    This visit occurred during the SARS-CoV-2 public health emergency.  Safety protocols were in place, including screening questions prior to the visit, additional usage of staff PPE, and extensive cleaning of exam room while observing appropriate contact time as indicated for disinfecting solutions.

## 2023-02-12 NOTE — Patient Instructions (Signed)
You can try alprazolam at bedtime (try to limit this to nights where you really have difficulty with sleep).  Try diclofenac for neck and back.   See me again in 3 months.

## 2023-02-12 NOTE — Assessment & Plan Note (Signed)
Continue with semaglutide.  Updated rx sent to [facility].

## 2023-02-12 NOTE — Assessment & Plan Note (Signed)
Management per endocrinology.  Stable at this time. 

## 2023-02-12 NOTE — Assessment & Plan Note (Signed)
Adding diclofenac as needed.

## 2023-02-12 NOTE — Assessment & Plan Note (Signed)
She continues to have grief related to the loss of her mother.  Significant difficulty with sleep.  Short term alprazolam as needed at bedtime.  Continue bupropion at current strength.

## 2023-02-19 ENCOUNTER — Telehealth: Payer: Self-pay | Admitting: Family Medicine

## 2023-02-19 NOTE — Telephone Encounter (Signed)
Refaxed medication request to [facility].

## 2023-02-19 NOTE — Telephone Encounter (Signed)
[facility] called to follow up on a fax they have not received for a medication.They asked for it be faxed again.

## 2023-03-04 ENCOUNTER — Encounter: Payer: Self-pay | Admitting: Family Medicine

## 2023-03-05 ENCOUNTER — Other Ambulatory Visit: Payer: Self-pay | Admitting: Family Medicine

## 2023-03-25 LAB — LIPID PANEL
Cholesterol: 242 — AB (ref 0–200)
HDL: 49 (ref 35–70)
LDL Cholesterol: 158
Triglycerides: 189 — AB (ref 40–160)

## 2023-03-25 LAB — BASIC METABOLIC PANEL
BUN: 6 (ref 4–21)
Chloride: 100 (ref 99–108)
Creatinine: 0.9 (ref 0.5–1.1)
Glucose: 80
Potassium: 4 meq/L (ref 3.5–5.1)
Sodium: 140 (ref 137–147)

## 2023-03-25 LAB — COMPREHENSIVE METABOLIC PANEL
Albumin: 4.9 (ref 3.5–5.0)
Globulin: 2.3

## 2023-03-25 LAB — HEPATIC FUNCTION PANEL
ALT: 40 U/L — AB (ref 7–35)
AST: 29 (ref 13–35)
Alkaline Phosphatase: 67 (ref 25–125)
Bilirubin, Total: 0.5

## 2023-03-25 LAB — IRON,TIBC AND FERRITIN PANEL: Iron: 146

## 2023-04-04 DIAGNOSIS — Z8742 Personal history of other diseases of the female genital tract: Secondary | ICD-10-CM | POA: Diagnosis not present

## 2023-04-22 ENCOUNTER — Telehealth: Payer: Self-pay | Admitting: Family Medicine

## 2023-04-22 NOTE — Telephone Encounter (Signed)
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 Paperwork is in Dr. Ashley Royalty box.

## 2023-04-29 ENCOUNTER — Encounter: Payer: Self-pay | Admitting: Family Medicine

## 2023-04-29 NOTE — Telephone Encounter (Signed)
Spoke to patient.  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

## 2023-04-29 NOTE — Telephone Encounter (Signed)
Pt called. She is following up on [employer].

## 2023-05-15 ENCOUNTER — Encounter: Payer: Self-pay | Admitting: Family Medicine

## 2023-05-15 ENCOUNTER — Ambulatory Visit: Payer: BC Managed Care – PPO | Admitting: Family Medicine

## 2023-05-15 VITALS — BP 125/83 | HR 78 | Ht 63.0 in | Wt 220.0 lb

## 2023-05-15 DIAGNOSIS — Z6841 Body Mass Index (BMI) 40.0 and over, adult: Secondary | ICD-10-CM | POA: Diagnosis not present

## 2023-05-15 DIAGNOSIS — F419 Anxiety disorder, unspecified: Secondary | ICD-10-CM

## 2023-05-15 MED ORDER — TRAZODONE HCL 50 MG PO TABS: 50.0000 mg | ORAL_TABLET | Freq: Every evening | ORAL | 3 refills | Status: DC | PRN

## 2023-05-15 MED ORDER — AMBULATORY NON FORMULARY MEDICATION: 1 refills | Status: AC

## 2023-05-15 MED ORDER — ALPRAZOLAM 0.25 MG PO TABS: 0.1250 mg | ORAL_TABLET | Freq: Every day | ORAL | 3 refills | Status: DC | PRN

## 2023-05-15 NOTE — Progress Notes (Signed)
Alexandra Greer - 52 y.o. female MRN [id number]  Date of birth: 09-Oct-1970  Subjective Chief Complaint  Patient presents with   Weight Loss    HPI Alexandra Greer is a 52 y.o. female here today for follow up visit.   She reports she is doing pretty well.  She does admit to some increased stress recently.  She currently living in a 2 bedroom apartment with her husband and son however her daughter has been living with her recently with her 3 kids as well.  This is creating more stress for her.  She is having some difficulty with sleep.  Using alprazolam at bedtime.  She has used trazodone in the past which worked pretty well for her.  She would be willing to try this again.  She was done quite well with compounded semaglutide previously.  She has been off of this for a few weeks due to cost.  She did restart about 2 weeks ago.  So far she is tolerating this well.  She denies significant side effects at this time.  ROS:  A comprehensive ROS was completed and negative except as noted per HPI  Allergies  Allergen Reactions   Nickel Itching, Rash and Hives   Topiramate     Past Medical History:  Diagnosis Date   Anxiety    Asthma    Colon polyps    Constipation    Depression    Hypothyroid    Joint pain    Low thyroid stimulating hormone (TSH) level     Past Surgical History:  Procedure Laterality Date   ABDOMINAL HYSTERECTOMY     BREAST SURGERY Left    lumpectomy    Social History   Socioeconomic History   Marital status: Married    Spouse name: Not on file   Number of children: Not on file   Years of education: Not on file   Highest education level: 12th grade  Occupational History   Not on file  Tobacco Use   Smoking status: Some Days    Current packs/day: 0.30    Average packs/day: 0.3 packs/day for 25.0 years (7.5 ttl pk-yrs)    Types: Cigarettes   Smokeless tobacco: Never  Vaping Use   Vaping status: Never Used  Substance and Sexual Activity   Alcohol  use: Never   Drug use: Never   Sexual activity: Yes    Partners: Male  Other Topics Concern   Not on file  Social History Narrative   Not on file   Social Determinants of Health   Financial Resource Strain: Patient Declined (02/11/2023)   Overall Financial Resource Strain (CARDIA)    Difficulty of Paying Living Expenses: Patient declined  Food Insecurity: Patient Declined (02/11/2023)   Hunger Vital Sign    Worried About Running Out of Food in the Last Year: Patient declined    Ran Out of Food in the Last Year: Patient declined  Transportation Needs: No Transportation Needs (02/11/2023)   PRAPARE - Administrator, Civil Service (Medical): No    Lack of Transportation (Non-Medical): No  Physical Activity: Unknown (02/11/2023)   Exercise Vital Sign    Days of Exercise per Week: Patient declined    Minutes of Exercise per Session: Not on file  Stress: Patient Declined (02/11/2023)   [employer] of Occupational Health - Occupational Stress Questionnaire    Feeling of Stress : Patient declined  Social Connections: Unknown (02/11/2023)   Social Connection and Isolation Panel [NHANES]  Frequency of Communication with Friends and Family: Patient declined    Frequency of Social Gatherings with Friends and Family: Patient declined    Attends Religious Services: Patient declined    Active Member of Clubs or Organizations: No    Attends Engineer, structural: Not on file    Marital Status: Married    Family History  Problem Relation Age of Onset   Hypertension Mother    Diabetes Mother    Hypertension Father     Health Maintenance  Topic Date Due   Hepatitis C Screening  05/15/2023 (Originally 10/25/1986)   HIV Screening  05/15/2023 (Originally 10/26/1983)   COVID-19 Vaccine (1) 05/31/2023 (Originally 10/25/1973)   Zoster Vaccines- Shingrix (1 of 2) 08/14/2023 (Originally 10/26/1987)   INFLUENZA VACCINE  12/08/2023 (Originally 04/10/2023)   PAP SMEAR-Modifier   03/16/2024   MAMMOGRAM  11/17/2024   Colonoscopy  02/16/2025   DTaP/Tdap/Td (2 - Td or Tdap) 10/18/2029   HPV VACCINES  Aged Out     ----------------------------------------------------------------------------------------------------------------------------------------------------------------------------------------------------------------- Physical Exam BP 125/83 (BP Location: Left Arm, Patient Position: Sitting, Cuff Size: Normal)   Pulse 78   Ht 5\' 3"  (1.6 m)   Wt 220 lb (99.8 kg)   SpO2 98%   BMI 38.97 kg/m   Physical Exam Constitutional:      Appearance: Normal appearance.  HENT:     Head: Normocephalic and atraumatic.  Eyes:     General: No scleral icterus. Cardiovascular:     Rate and Rhythm: Normal rate and regular rhythm.  Pulmonary:     Effort: Pulmonary effort is normal.     Breath sounds: Normal breath sounds.  Musculoskeletal:     Cervical back: Neck supple.  Neurological:     Mental Status: She is alert.  Psychiatric:        Mood and Affect: Mood normal.        Behavior: Behavior normal.     ------------------------------------------------------------------------------------------------------------------------------------------------------------------------------------------------------------------- Assessment and Plan  Anxiety Increasing anxiety with insomnia.  We discussed trying to limit alprazolam.  Adding trazodone.  Class 3 severe obesity due to excess calories without serious comorbidity with body mass index (BMI) of 40.0 to 44.9 in adult [facility]) She is doing well well with compounded semaglutide.  Will continue at current strength.   Meds ordered this encounter  Medications   AMBULATORY NON FORMULARY MEDICATION    Sig: Semaglutide 2.5 mg/mL + Vit B6 10mg /mL.    Inject 10 units/0.25 mg subcutaneous weekly for 4 weeks then 20 units/0.5 mg subcutaneous weekly for 4 weeks, then 40 units/1 mg weekly. Dispense 2mL.    Dispense:  2 mL    Refill:   1   DISCONTD: traZODone (DESYREL) 50 MG tablet    Sig: Take 1-2 tablets (50-100 mg total) by mouth at bedtime as needed for sleep.    Dispense:  90 tablet    Refill:  3   DISCONTD: ALPRAZolam (XANAX) 0.25 MG tablet    Sig: Take 0.5-1 tablets (0.125-0.25 mg total) by mouth daily as needed for anxiety.    Dispense:  20 tablet    Refill:  3   ALPRAZolam (XANAX) 0.25 MG tablet    Sig: Take 0.5-1 tablets (0.125-0.25 mg total) by mouth daily as needed for anxiety.    Dispense:  20 tablet    Refill:  3   traZODone (DESYREL) 50 MG tablet    Sig: Take 1-2 tablets (50-100 mg total) by mouth at bedtime as needed for sleep.    Dispense:  90  tablet    Refill:  3    Return in about 3 months (around 08/14/2023) for weight mgmt/Insomnia.    This visit occurred during the SARS-CoV-2 public health emergency.  Safety protocols were in place, including screening questions prior to the visit, additional usage of staff PPE, and extensive cleaning of exam room while observing appropriate contact time as indicated for disinfecting solutions.

## 2023-05-15 NOTE — Assessment & Plan Note (Signed)
She is doing well well with compounded semaglutide.  Will continue at current strength.

## 2023-05-15 NOTE — Assessment & Plan Note (Signed)
Increasing anxiety with insomnia.  We discussed trying to limit alprazolam.  Adding trazodone.

## 2023-05-23 DIAGNOSIS — F411 Generalized anxiety disorder: Secondary | ICD-10-CM | POA: Diagnosis not present

## 2023-05-23 DIAGNOSIS — F330 Major depressive disorder, recurrent, mild: Secondary | ICD-10-CM | POA: Diagnosis not present

## 2023-05-27 DIAGNOSIS — F411 Generalized anxiety disorder: Secondary | ICD-10-CM | POA: Diagnosis not present

## 2023-05-27 DIAGNOSIS — F330 Major depressive disorder, recurrent, mild: Secondary | ICD-10-CM | POA: Diagnosis not present

## 2023-06-10 DIAGNOSIS — F411 Generalized anxiety disorder: Secondary | ICD-10-CM | POA: Diagnosis not present

## 2023-06-10 DIAGNOSIS — F330 Major depressive disorder, recurrent, mild: Secondary | ICD-10-CM | POA: Diagnosis not present

## 2023-08-06 DIAGNOSIS — E039 Hypothyroidism, unspecified: Secondary | ICD-10-CM | POA: Diagnosis not present

## 2023-08-14 ENCOUNTER — Ambulatory Visit: Payer: BC Managed Care – PPO | Admitting: Family Medicine

## 2023-08-14 ENCOUNTER — Encounter: Payer: Self-pay | Admitting: Family Medicine

## 2023-08-14 VITALS — BP 120/83 | HR 67 | Ht 63.0 in | Wt 221.0 lb

## 2023-08-14 DIAGNOSIS — F419 Anxiety disorder, unspecified: Secondary | ICD-10-CM | POA: Diagnosis not present

## 2023-08-14 DIAGNOSIS — E66813 Obesity, class 3: Secondary | ICD-10-CM | POA: Diagnosis not present

## 2023-08-14 DIAGNOSIS — F5101 Primary insomnia: Secondary | ICD-10-CM | POA: Diagnosis not present

## 2023-08-14 DIAGNOSIS — G4700 Insomnia, unspecified: Secondary | ICD-10-CM | POA: Insufficient documentation

## 2023-08-14 DIAGNOSIS — Z6841 Body Mass Index (BMI) 40.0 and over, adult: Secondary | ICD-10-CM

## 2023-08-14 MED ORDER — ALPRAZOLAM 0.25 MG PO TABS: 0.1250 mg | ORAL_TABLET | Freq: Every day | ORAL | 3 refills | Status: DC | PRN

## 2023-08-14 MED ORDER — AMBULATORY NON FORMULARY MEDICATION: 2 refills | Status: DC

## 2023-08-14 NOTE — Progress Notes (Signed)
Pt declined all immunizations.

## 2023-08-14 NOTE — Assessment & Plan Note (Signed)
Continue trazodone 

## 2023-08-14 NOTE — Assessment & Plan Note (Signed)
She did try counseling for her anxiety and grief but this did not go well.  I encouraged her to give it another try with a different counselor.  She may continue alprazolam for panic, encouraged to use sparingly.

## 2023-08-14 NOTE — Assessment & Plan Note (Signed)
She is doing well well with compounded semaglutide.  Will increase to 60 units (1.5mg ) weekly.  F/u in 4 months.

## 2023-08-14 NOTE — Progress Notes (Signed)
Neah Nader - 52 y.o. female MRN [id number]  Date of birth: [birth year]  Subjective Chief Complaint  Patient presents with   Medical Management of Chronic Issues    HPI Camrin Alamin is a 52 y.o. female here today for follow up.   She has been using compounded semaglutide to help with weight management.  She has not really lost any additional weight since last visit but weight has been stable.  She has been filling every other month due to cost.  She would like to try increased strength.   Anxiety and sleep are stable.  She is using alprazolam occasionally for panic symptoms.  Trazodone is fairly effective for sleep.  No side effects.  She did try counseling for her anxiety, however she did not have a good experience as she felt that her therapist was not engaged or helpful.  She did not return after 3 sessions. She may consider trying somewhere else.   ROS:  A comprehensive ROS was completed and negative except as noted per HPI  Allergies  Allergen Reactions   Nickel Itching, Rash and Hives   Topiramate     Past Medical History:  Diagnosis Date   Anxiety    Asthma    Colon polyps    Constipation    Depression    Hypothyroid    Joint pain    Low thyroid stimulating hormone (TSH) level     Past Surgical History:  Procedure Laterality Date   ABDOMINAL HYSTERECTOMY     BREAST SURGERY Left    lumpectomy    Social History   Socioeconomic History   Marital status: Married    Spouse name: Not on file   Number of children: Not on file   Years of education: Not on file   Highest education level: Associate degree: occupational, Scientist, product/process development, or vocational program  Occupational History   Not on file  Tobacco Use   Smoking status: Some Days    Current packs/day: 0.30    Average packs/day: 0.3 packs/day for 25.0 years (7.5 ttl pk-yrs)    Types: Cigarettes   Smokeless tobacco: Never  Vaping Use   Vaping status: Never Used  Substance and Sexual Activity   Alcohol  use: Never   Drug use: Never   Sexual activity: Yes    Partners: Male  Other Topics Concern   Not on file  Social History Narrative   Not on file   Social Determinants of Health   Financial Resource Strain: Low Risk  (08/13/2023)   Overall Financial Resource Strain (CARDIA)    Difficulty of Paying Living Expenses: Not very hard  Food Insecurity: No Food Insecurity (08/13/2023)   Hunger Vital Sign    Worried About Running Out of Food in the Last Year: Never true    Ran Out of Food in the Last Year: Never true  Transportation Needs: No Transportation Needs (08/13/2023)   PRAPARE - Administrator, Civil Service (Medical): No    Lack of Transportation (Non-Medical): No  Physical Activity: Insufficiently Active (08/13/2023)   Exercise Vital Sign    Days of Exercise per Week: 1 day    Minutes of Exercise per Session: 10 min  Stress: Stress Concern Present (08/13/2023)   [employer] of Occupational Health - Occupational Stress Questionnaire    Feeling of Stress : To some extent  Social Connections: Moderately Isolated (08/13/2023)   Social Connection and Isolation Panel [NHANES]    Frequency of Communication with Friends and Family: More than  three times a week    Frequency of Social Gatherings with Friends and Family: Twice a week    Attends Religious Services: Never    Database administrator or Organizations: No    Attends Engineer, structural: Not on file    Marital Status: Married    Family History  Problem Relation Age of Onset   Hypertension Mother    Diabetes Mother    Hypertension Father     Health Maintenance  Topic Date Due   COVID-19 Vaccine (1) Never done   HIV Screening  Never done   Hepatitis C Screening  Never done   Zoster Vaccines- Shingrix (1 of 2) 08/14/2023 (Originally 10/26/1987)   INFLUENZA VACCINE  12/08/2023 (Originally 04/10/2023)   MAMMOGRAM  11/17/2024   Colonoscopy  02/16/2025   DTaP/Tdap/Td (2 - Td or Tdap) 10/18/2029    HPV VACCINES  Aged Out     ----------------------------------------------------------------------------------------------------------------------------------------------------------------------------------------------------------------- Physical Exam BP 120/83 (BP Location: Left Arm, Patient Position: Sitting, Cuff Size: Normal)   Pulse 67   Ht 5\' 3"  (1.6 m)   Wt 221 lb (100.2 kg)   SpO2 98%   BMI 39.15 kg/m   Physical Exam Constitutional:      Appearance: Normal appearance.  Cardiovascular:     Rate and Rhythm: Normal rate and regular rhythm.  Pulmonary:     Effort: Pulmonary effort is normal.     Breath sounds: Normal breath sounds.  Neurological:     Mental Status: She is alert.     ------------------------------------------------------------------------------------------------------------------------------------------------------------------------------------------------------------------- Assessment and Plan  Anxiety She did try counseling for her anxiety and grief but this did not go well.  I encouraged her to give it another try with a different counselor.  She may continue alprazolam for panic, encouraged to use sparingly.    Insomnia Continue trazodone  Class 3 severe obesity due to excess calories without serious comorbidity with body mass index (BMI) of 40.0 to 44.9 in adult [facility]) She is doing well well with compounded semaglutide.  Will increase to 60 units (1.5mg ) weekly.  F/u in 4 months.    Meds ordered this encounter  Medications   AMBULATORY NON FORMULARY MEDICATION    Sig: Semaglutide 2.5 mg/mL + Vit B6 10mg /mL.   Inject 60 units (1.5mg ) weekly.    Dispense:  4 mL    Refill:  2   ALPRAZolam (XANAX) 0.25 MG tablet    Sig: Take 0.5-1 tablets (0.125-0.25 mg total) by mouth daily as needed for anxiety.    Dispense:  20 tablet    Refill:  3    Return in about 4 months (around 12/13/2023) for Weight mgmt. .    This visit occurred during the  SARS-CoV-2 public health emergency.  Safety protocols were in place, including screening questions prior to the visit, additional usage of staff PPE, and extensive cleaning of exam room while observing appropriate contact time as indicated for disinfecting solutions.

## 2023-09-08 ENCOUNTER — Other Ambulatory Visit: Payer: Self-pay | Admitting: Family Medicine

## 2023-09-09 ENCOUNTER — Encounter: Payer: Self-pay | Admitting: Family Medicine

## 2023-10-05 ENCOUNTER — Other Ambulatory Visit: Payer: Self-pay | Admitting: Family Medicine

## 2023-10-15 DIAGNOSIS — L732 Hidradenitis suppurativa: Secondary | ICD-10-CM | POA: Diagnosis not present

## 2023-10-29 ENCOUNTER — Other Ambulatory Visit: Payer: Self-pay | Admitting: Family Medicine

## 2023-10-29 NOTE — Telephone Encounter (Signed)
 Copied from CRM [id number]. Topic: Clinical - Medication Refill >> Oct 29, 2023 12:12 PM Mosetta Putt H wrote: Most Recent Primary Care Visit:  Provider: Everrett Coombe  Department: [facility]  Visit Type: OFFICE VISIT  Date: 08/14/2023  Medication: albuterol (VENTOLIN HFA) 108 (90 Base) MCG/ACT inhaler   Has the patient contacted their pharmacy? Yes (Agent: If no, request that the patient contact the pharmacy for the refill. If patient does not wish to contact the pharmacy document the reason why and proceed with request.) (Agent: If yes, when and what did the pharmacy advise?)  Is this the correct pharmacy for this prescription? Yes If no, delete pharmacy and type the correct one.  This is the patient's preferred pharmacy:  [id number] - [place], [place] - [address] AT [facility] [address] [place] [address] Phone: [phone] Fax: [phone]   Has the prescription been filled recently? No  Is the patient out of the medication? Yes  Has the patient been seen for an appointment in the last year OR does the patient have an upcoming appointment? Yes  Can we respond through MyChart? Yes  Agent: Please be advised that Rx refills may take up to 3 business days. We ask that you follow-up with your pharmacy.

## 2023-10-31 ENCOUNTER — Telehealth: Payer: Self-pay

## 2023-10-31 MED ORDER — ALBUTEROL SULFATE HFA 108 (90 BASE) MCG/ACT IN AERS: 2.0000 | INHALATION_SPRAY | RESPIRATORY_TRACT | 0 refills | Status: DC | PRN

## 2023-10-31 NOTE — Telephone Encounter (Signed)
 Patient requesting rx rf of albuterol HFA  States completely out of medicaiton requesting rx rf ASAP Last written 06/25/2022 Last OV 08/14/2023  Upcoming appt 12/12/23. Per Christen Butter , NP will refill for one cannister.  Patient will need to keep upcoming appt for 12/12/23 and if needing further refills will need to call and schedule an earlier appt .

## 2023-10-31 NOTE — Telephone Encounter (Signed)
 Copied from CRM [id number]. Topic: Clinical - Prescription Issue >> Oct 29, 2023  2:28 PM Antony Haste wrote: Reason for CRM: PT is needing albuterol (VENTOLIN HFA) 108 (90 Base) MCG/ACT inhaler signed off to her pharmacy as soon as possible, she is completely out. She stated she attempted to request this refill through MyChart a few weeks ago and her request was denied. I advised it has been requested as of today and still awaiting a sign off by her provider and that a refill request may take up to 72 hours for completion. PT understood.

## 2023-11-09 DIAGNOSIS — J069 Acute upper respiratory infection, unspecified: Secondary | ICD-10-CM | POA: Diagnosis not present

## 2023-11-09 DIAGNOSIS — R051 Acute cough: Secondary | ICD-10-CM | POA: Diagnosis not present

## 2023-11-19 DIAGNOSIS — Z1231 Encounter for screening mammogram for malignant neoplasm of breast: Secondary | ICD-10-CM | POA: Diagnosis not present

## 2023-11-23 ENCOUNTER — Other Ambulatory Visit: Payer: Self-pay | Admitting: Family Medicine

## 2023-11-23 ENCOUNTER — Ambulatory Visit: Admission: EM | Admit: 2023-11-23 | Discharge: 2023-11-23 | Disposition: A | Discharge status: Home / Self Care

## 2023-11-23 DIAGNOSIS — M26621 Arthralgia of right temporomandibular joint: Secondary | ICD-10-CM

## 2023-11-23 MED ORDER — METHOCARBAMOL 750 MG PO TABS: 750.0000 mg | ORAL_TABLET | Freq: Three times a day (TID) | ORAL | 0 refills | Status: DC | PRN

## 2023-11-23 NOTE — Discharge Instructions (Addendum)
 At this time I suspect that your pain is likely from your jaw since your ears and throat appear normal and there is no swelling in your lymph nodes or salivary glands. Given your concerns for teeth grinding and clenching your jaw, I suspect that you may have a bit of TMJ dysfunction at this time.  I have included a few exercises that you can do to help improve your drawl range of motion.  I have also included a patient education resource regarding TMJ syndrome.  I have sent in a prescription for Robaxin.  This is a muscle relaxer so it can cause sedation but it has been shown to help with temporomandibular joint pain and discomfort.  I would recommend getting a mouthguard to sleep in.  You can find several options online that you can do at home or you can speak to your dentist but I do recommend some kind of guard to prevent damage to your teeth.  Your dentist can also sometimes provide guidance regarding TMJ pain and dysfunction should it persist.  You can also speak to your primary care provider for ongoing concerns if your symptoms do not seem to be improving with the management options that we discussed today.

## 2023-11-23 NOTE — ED Provider Notes (Signed)
 [facility]    CSN: [id number] Arrival date & time: 11/23/23  0803      History   Chief Complaint Chief Complaint  Patient presents with   Otalgia    HPI Alexandra Greer is a 53 y.o. female.   HPI   She states she woke up this AM with right ear pain She states she also has some pain with swallowing and opening/closing her jaw  She states during her apt she also swallowed some of her beverage and had a sharp pain in her throat  She reports when she wakes up she sometimes finds that she is clenching her jaw and thinks she grinds her teeth in her sleep    Past Medical History:  Diagnosis Date   Anxiety    Asthma    Colon polyps    Constipation    Depression    Hypothyroid    Joint pain    Low thyroid stimulating hormone (TSH) level     Patient Active Problem List   Diagnosis Date Noted   Insomnia 08/14/2023   Grief 02/12/2023   Neck muscle spasm 08/13/2022   Abnormal weight gain 09/13/2021   Class 3 severe obesity due to excess calories without serious comorbidity with body mass index (BMI) of 40.0 to 44.9 in adult [facility]) 09/13/2021   Anxiety 07/10/2021   Family history of colon cancer in father 02/10/2020   H/O: hysterectomy 02/10/2020   Former smoker 02/10/2020   Lumbar spondylosis 02/10/2020   Disc disease, degenerative, cervical 02/10/2020   Mild intermittent asthma without complication 02/10/2020   Mild obstructive sleep apnea 10/28/2018   Subcutaneous nodule of breast 06/20/2016   Hypothyroidism 09/12/2011    Past Surgical History:  Procedure Laterality Date   ABDOMINAL HYSTERECTOMY     BREAST SURGERY Left    lumpectomy    OB History     Gravida  2   Para  2   Term  2   Preterm      AB      Living  2      SAB      IAB      Ectopic      Multiple      Live Births  2            Home Medications    Prior to Admission medications   Medication Sig Start Date End Date Taking? Authorizing Provider  clindamycin  (CLINDAGEL) 1 % gel Apply to active lesions twice daily as needed 10/15/23  Yes [provider]  methocarbamol (ROBAXIN-750) 750 MG tablet Take 1 tablet (750 mg total) by mouth every 8 (eight) hours as needed for muscle spasms. 11/23/23  Yes Rutha Melgoza E, PA-C  silver sulfADIAZINE (SILVADENE) 1 % cream Apply to inflamed affected spots daily as needed 10/15/23  Yes [provider]  spironolactone (ALDACTONE) 50 MG tablet Take 1 tablet by mouth daily. 10/15/23 10/14/24 Yes [provider]  albuterol (VENTOLIN HFA) 108 (90 Base) MCG/ACT inhaler Inhale 2 puffs into the lungs every 4 (four) hours as needed for wheezing or shortness of breath. 10/31/23  Yes Christen Butter, NP  ALPRAZolam (XANAX) 0.25 MG tablet Take 0.5-1 tablets (0.125-0.25 mg total) by mouth daily as needed for anxiety. 08/14/23  Yes Everrett Coombe, DO  AMBULATORY NON FORMULARY MEDICATION Semaglutide 2.5 mg/mL + Vit B6 10mg /mL.    Inject 10 units/0.25 mg subcutaneous weekly for 4 weeks then 20 units/0.5 mg subcutaneous weekly for 4 weeks, then 40 units/1  mg weekly. Dispense 2mL. 05/15/23  Yes Everrett Coombe, DO  AMBULATORY NON FORMULARY MEDICATION Semaglutide 2.5 mg/mL + Vit B6 10mg /mL.   Inject 60 units (1.5mg ) weekly. 08/14/23  Yes Everrett Coombe, DO  Azelastine-Fluticasone (DYMISTA) 137-50 MCG/ACT SUSP Place 1 spray into the nose every 12 (twelve) hours. 11/08/22 12/08/22  Theadora Rama Scales, PA-C  budesonide-formoterol (SYMBICORT) 160-4.5 MCG/ACT inhaler Inhale 2 puffs into the lungs 2 (two) times daily. 05/14/22   [provider]  buPROPion (WELLBUTRIN XL) 150 MG 24 hr tablet Take 150 mg by mouth daily. 07/15/21  Yes [provider]  levothyroxine (SYNTHROID) 100 MCG tablet Take 1 tablet (100 mcg total) by mouth daily before breakfast. 02/10/20  Yes Sunnie Nielsen, DO  moxifloxacin (AVELOX) 400 MG tablet Take 400 mg by mouth daily.   Yes [provider]  nystatin (MYCOSTATIN/NYSTOP) powder APPLY  TO AFFECTED AREA TWICE A DAY 10/01/21   [provider]  traZODone (DESYREL) 50 MG tablet TAKE 1-2 TABLETS BY MOUTH AT BEDTIME AS NEEDED FOR SLEEP. 10/07/23   Everrett Coombe, DO  budesonide (PULMICORT) 0.5 MG/2ML nebulizer solution Take 2 mLs (0.5 mg total) by nebulization 2 (two) times daily as needed. 03/14/20 12/03/20  Tollie Eth, NP    Family History Family History  Problem Relation Age of Onset   Hypertension Mother    Hypothyroidism Mother    Dementia Mother    Atrial fibrillation Father     Social History Social History   Tobacco Use   Smoking status: Every Day    Current packs/day: 0.30    Average packs/day: 0.3 packs/day for 25.0 years (7.5 ttl pk-yrs)    Types: Cigarettes    Passive exposure: Current   Smokeless tobacco: Never  Vaping Use   Vaping status: Never Used  Substance Use Topics   Alcohol use: Never   Drug use: Never     Allergies   Nickel and Topiramate   Review of Systems Review of Systems  Constitutional:  Negative for chills and fever.  HENT:  Positive for ear pain and sore throat.      Physical Exam Triage Vital Signs ED Triage Vitals  Encounter Vitals Group     BP 11/23/23 0826 117/87     Systolic BP Percentile --      Diastolic BP Percentile --      Pulse Rate 11/23/23 0826 66     Resp 11/23/23 0826 18     Temp 11/23/23 0826 98.3 F (36.8 C)     Temp Source 11/23/23 0826 Oral     SpO2 11/23/23 0826 97 %     Weight --      Height --      Head Circumference --      Peak Flow --      Pain Score 11/23/23 0817 3     Pain Loc --      Pain Education --      Exclude from Growth Chart --    No data found.  Updated Vital Signs BP 117/87 (BP Location: Right Arm)   Pulse 66   Temp 98.3 F (36.8 C) (Oral)   Resp 18   SpO2 97%   Visual Acuity Right Eye Distance:   Left Eye Distance:   Bilateral Distance:    Right Eye Near:   Left Eye Near:    Bilateral Near:     Physical Exam Vitals reviewed.  Constitutional:       General: She is awake.  Appearance: Normal appearance. She is well-developed and well-groomed.  HENT:     Head: Normocephalic and atraumatic.     Jaw: Tenderness and pain on movement present.     Right Ear: Hearing, tympanic membrane and ear canal normal.     Left Ear: Hearing and ear canal normal. There is impacted cerumen.     Mouth/Throat:     Lips: Pink.     Mouth: Mucous membranes are moist.     Tongue: No lesions. Tongue does not deviate from midline.     Pharynx: Oropharynx is clear. Uvula midline. No pharyngeal swelling, oropharyngeal exudate, posterior oropharyngeal erythema, uvula swelling or postnasal drip.     Tonsils: No tonsillar exudate or tonsillar abscesses. 0 on the right. 1+ on the left.  Pulmonary:     Effort: Pulmonary effort is normal.  Musculoskeletal:     Cervical back: Normal range of motion.  Lymphadenopathy:     Head:     Right side of head: No submental, submandibular or preauricular adenopathy.     Left side of head: No submental, submandibular or preauricular adenopathy.     Cervical:     Right cervical: No superficial cervical adenopathy.    Left cervical: No superficial cervical adenopathy.     Upper Body:     Right upper body: No supraclavicular adenopathy.     Left upper body: No supraclavicular adenopathy.  Neurological:     General: No focal deficit present.     Mental Status: She is alert and oriented to person, place, and time.     GCS: GCS eye subscore is 4. GCS verbal subscore is 5. GCS motor subscore is 6.     Cranial Nerves: No cranial nerve deficit, dysarthria or facial asymmetry.     Motor: No weakness, tremor, atrophy or abnormal muscle tone.  Psychiatric:        Behavior: Behavior is cooperative.      UC Treatments / Results  Labs (all labs ordered are listed, but only abnormal results are displayed) Labs Reviewed - No data to display  EKG   Radiology No results found.  Procedures Procedures (including critical care  time)  Medications Ordered in UC Medications - No data to display  Initial Impression / Assessment and Plan / UC Course  I have reviewed the triage vital signs and the nursing notes.  Pertinent labs & imaging results that were available during my care of the patient were reviewed by me and considered in my medical decision making (see chart for details).      Final Clinical Impressions(s) / UC Diagnoses   Final diagnoses:  TMJ tenderness, right   Patient presents today with concerns for right-sided ear pain as well as tenderness and pain along the right side of the neck, potential sore throat.  She denies recent fever, chills or recent sick contacts.  Physical exam was reassuring with regards to right tympanic membrane, ear canal.  Left ear was occluded by cerumen but canal appears normal.  No lymphadenopathy or swelling to salivary glands on palpation of periauricular, jaw, submandibular, submental, neck spaces.  Mouth, pharynx and tonsils all appear normal to visual inspection.  Patient did admit to clenching her jaw and concerns for grinding her teeth at night.  Reviewed with her that this could be causing some temporomandibular joint discomfort and pain which may be causing the pain that she is experiencing along her right ear.  Will provide Robaxin-750 milligram p.o. 3 times daily as needed to  assist with jaw tightness.  Will also provide gentle range of motion stretches and patient education materials on TMJ dysfunction.  I recommend following up with her dentist for further assistance with regards to clenching, potential bruxism.  Reviewed that she can get mouthguard's online or at local stores to protect her teeth while she is waiting to see a dentist. Recommend close follow-up with primary care provider if symptoms are persisting or not improving.  Follow-up as needed for progressing or persistent symptoms    Discharge Instructions      At this time I suspect that your pain is  likely from your jaw since your ears and throat appear normal and there is no swelling in your lymph nodes or salivary glands. Given your concerns for teeth grinding and clenching your jaw, I suspect that you may have a bit of TMJ dysfunction at this time.  I have included a few exercises that you can do to help improve your drawl range of motion.  I have also included a patient education resource regarding TMJ syndrome.  I have sent in a prescription for Robaxin.  This is a muscle relaxer so it can cause sedation but it has been shown to help with temporomandibular joint pain and discomfort.  I would recommend getting a mouthguard to sleep in.  You can find several options online that you can do at home or you can speak to your dentist but I do recommend some kind of guard to prevent damage to your teeth.  Your dentist can also sometimes provide guidance regarding TMJ pain and dysfunction should it persist.  You can also speak to your primary care provider for ongoing concerns if your symptoms do not seem to be improving with the management options that we discussed today.     ED Prescriptions     Medication Sig Dispense Auth. Provider   methocarbamol (ROBAXIN-750) 750 MG tablet Take 1 tablet (750 mg total) by mouth every 8 (eight) hours as needed for muscle spasms. 30 tablet Yulissa Needham E, PA-C      PDMP not reviewed this encounter.   Jessalyn Hinojosa, Oswaldo Conroy, PA-C 11/23/23 1104

## 2023-11-23 NOTE — ED Triage Notes (Signed)
 Pt woke up today with right ear pain.   Interventions: None

## 2023-11-27 ENCOUNTER — Other Ambulatory Visit: Payer: Self-pay | Admitting: Medical-Surgical

## 2023-12-01 ENCOUNTER — Other Ambulatory Visit: Payer: Self-pay | Admitting: Family Medicine

## 2023-12-01 MED ORDER — ALPRAZOLAM 0.25 MG PO TABS
0.1250 mg | ORAL_TABLET | Freq: Every day | ORAL | 3 refills | Status: DC | PRN
Start: 2023-12-01 — End: 2024-03-24

## 2023-12-01 NOTE — Telephone Encounter (Signed)
 Requesting Alprazolam 0.25mg  Last written 08/14/2023 Last OV 08/14/2023 Upcoming appt 12/08/23

## 2023-12-01 NOTE — Telephone Encounter (Signed)
 Copied from CRM [id number]. Topic: Clinical - Prescription Issue >> Dec 01, 2023  1:45 PM [name] D wrote: [facility]-  ALPRAZolam (XANAX) 0.25 MG tablet And she was told it has no refills

## 2023-12-08 ENCOUNTER — Encounter: Payer: Self-pay | Admitting: Family Medicine

## 2023-12-08 ENCOUNTER — Ambulatory Visit: Admitting: Family Medicine

## 2023-12-08 ENCOUNTER — Other Ambulatory Visit: Payer: Self-pay | Admitting: Family Medicine

## 2023-12-08 VITALS — BP 122/86 | HR 79 | Ht 63.0 in | Wt 211.0 lb

## 2023-12-08 DIAGNOSIS — R635 Abnormal weight gain: Secondary | ICD-10-CM | POA: Diagnosis not present

## 2023-12-08 DIAGNOSIS — M47816 Spondylosis without myelopathy or radiculopathy, lumbar region: Secondary | ICD-10-CM

## 2023-12-08 DIAGNOSIS — M25532 Pain in left wrist: Secondary | ICD-10-CM | POA: Insufficient documentation

## 2023-12-08 MED ORDER — PREDNISONE 10 MG (21) PO TBPK: ORAL_TABLET | ORAL | 0 refills | Status: DC

## 2023-12-08 MED ORDER — SEMAGLUTIDE-WEIGHT MANAGEMENT 1 MG/0.5ML ~~LOC~~ SOAJ: 1.0000 mg | SUBCUTANEOUS | 0 refills | Status: DC

## 2023-12-08 MED ORDER — SEMAGLUTIDE-WEIGHT MANAGEMENT 2.4 MG/0.75ML ~~LOC~~ SOAJ
2.4000 mg | SUBCUTANEOUS | 0 refills | Status: DC
Start: 2024-04-02 — End: 2023-12-08

## 2023-12-08 MED ORDER — SEMAGLUTIDE-WEIGHT MANAGEMENT 1.7 MG/0.75ML ~~LOC~~ SOAJ: 1.7000 mg | SUBCUTANEOUS | 0 refills | Status: DC

## 2023-12-08 NOTE — Assessment & Plan Note (Signed)
 She is having some increased pain in her lower back.  Also has some pain in the lateral hip.  I think she has some greater trochanteric bursitis with acute on chronic exacerbation of her back pain.  Will add prednisone taper as well as home exercises.

## 2023-12-08 NOTE — Assessment & Plan Note (Signed)
 Prednisone taper for back pain may help with wrist pain as well.

## 2023-12-08 NOTE — Progress Notes (Signed)
 Alexandra Greer - 53 y.o. female MRN [id number]  Date of birth: [birth year]  Subjective Chief Complaint  Patient presents with   Wrist Injury   Back Pain    HPI Alexandra Greer is a 53 year old female here today for follow-up.  She has been using compounded semaglutide for weight management.  She has been doing pretty well with this.  No significant side effects.  Weight is down an additional 10 pounds since last visit.  They are discontinuing compounded semaglutide and transitioning to tirzepatide however she is unsure if she can afford to continue on this.  She would like to see if Reginal Lutes may be covered under her insurance at this point.  She has some mild wrist pain.  Worse with flexion.  Denies any injury.  No numbness or tingling into the hands.  She was having some pain in her lower back that radiates into her right buttock and hip.  Pain in the hip is along the lateral hip.  No radiation down the leg, numbness or tingling.  ROS:  A comprehensive ROS was completed and negative except as noted per HPI  Allergies  Allergen Reactions   Nickel Hives, Itching and Rash   Topiramate Other (See Comments)    Past Medical History:  Diagnosis Date   Anxiety    Asthma    Colon polyps    Constipation    Depression    Hypothyroid    Joint pain    Low thyroid stimulating hormone (TSH) level     Past Surgical History:  Procedure Laterality Date   ABDOMINAL HYSTERECTOMY     BREAST SURGERY Left    lumpectomy    Social History   Socioeconomic History   Marital status: Married    Spouse name: Not on file   Number of children: Not on file   Years of education: Not on file   Highest education level: Some college, no degree  Occupational History   Not on file  Tobacco Use   Smoking status: Every Day    Current packs/day: 0.30    Average packs/day: 0.3 packs/day for 25.0 years (7.5 ttl pk-yrs)    Types: Cigarettes    Passive exposure: Current   Smokeless tobacco: Never   Vaping Use   Vaping status: Never Used  Substance and Sexual Activity   Alcohol use: Never   Drug use: Never   Sexual activity: Yes    Partners: Male    Birth control/protection: Surgical  Other Topics Concern   Not on file  Social History Narrative   Not on file   Social Drivers of Health   Financial Resource Strain: Low Risk  (12/01/2023)   Overall Financial Resource Strain (CARDIA)    Difficulty of Paying Living Expenses: Not hard at all  Food Insecurity: No Food Insecurity (12/01/2023)   Hunger Vital Sign    Worried About Running Out of Food in the Last Year: Never true    Ran Out of Food in the Last Year: Never true  Transportation Needs: No Transportation Needs (12/01/2023)   PRAPARE - Administrator, Civil Service (Medical): No    Lack of Transportation (Non-Medical): No  Physical Activity: Insufficiently Active (12/01/2023)   Exercise Vital Sign    Days of Exercise per Week: 3 days    Minutes of Exercise per Session: 20 min  Stress: No Stress Concern Present (12/01/2023)   [employer] of Occupational Health - Occupational Stress Questionnaire    Feeling of Stress : Only  a little  Social Connections: Socially Integrated (12/01/2023)   Social Connection and Isolation Panel [NHANES]    Frequency of Communication with Friends and Family: More than three times a week    Frequency of Social Gatherings with Friends and Family: Twice a week    Attends Religious Services: More than 4 times per year    Active Member of [employer] or Organizations: Yes    Attends Engineer, structural: More than 4 times per year    Marital Status: Married    Family History  Problem Relation Age of Onset   Hypertension Mother    Hypothyroidism Mother    Dementia Mother    Atrial fibrillation Father     Health Maintenance  Topic Date Due   COVID-19 Vaccine (1) Never done   Pneumococcal Vaccine 53-12 Years old (1 of 2 - PCV) Never done   HIV Screening  Never done    Hepatitis C Screening  Never done   Zoster Vaccines- Shingrix (1 of 2) Never done   INFLUENZA VACCINE  12/08/2023 (Originally 04/10/2023)   MAMMOGRAM  11/17/2024   Colonoscopy  02/16/2025   DTaP/Tdap/Td (2 - Td or Tdap) 10/18/2029   HPV VACCINES  Aged Out     ----------------------------------------------------------------------------------------------------------------------------------------------------------------------------------------------------------------- Physical Exam BP 122/86 (BP Location: Left Arm, Patient Position: Sitting, Cuff Size: Large)   Pulse 79   Ht 5\' 3"  (1.6 m)   Wt 211 lb (95.7 kg)   SpO2 98%   BMI 37.38 kg/m   Physical Exam Constitutional:      Appearance: Normal appearance.  HENT:     Head: Normocephalic and atraumatic.  Eyes:     General: No scleral icterus. Cardiovascular:     Rate and Rhythm: Normal rate and regular rhythm.  Pulmonary:     Effort: Pulmonary effort is normal.     Breath sounds: Normal breath sounds.  Musculoskeletal:     Comments: Increased pain with flexion of the left wrist.  No swelling or synovitis.  Negative Tinel sign the carpal tunnel.  Tenderness along the right greater trochanter as well as the upper gluteus on the right side.  Neurological:     Mental Status: She is alert.     ------------------------------------------------------------------------------------------------------------------------------------------------------------------------------------------------------------------- Assessment and Plan  Abnormal weight gain She has done quite well with semaglutide however compounding pharmacy is discontinuing this.  Tirzepatide is available however she is not sure if she can afford to continue this.  Will see if her insurance will cover [phone].  Lumbar spondylosis She is having some increased pain in her lower back.  Also has some pain in the lateral hip.  I think she has some greater trochanteric bursitis with  acute on chronic exacerbation of her back pain.  Will add prednisone taper as well as home exercises.  Left wrist pain Prednisone taper for back pain may help with wrist pain as well.   Meds ordered this encounter  Medications   predniSONE (STERAPRED UNI-PAK 21 TAB) 10 MG (21) TBPK tablet    Sig: Taper as directed on packaging.    Dispense:  21 tablet    Refill:  0   Semaglutide-Weight Management 1 MG/0.5ML SOAJ    Sig: Inject 1 mg into the skin once a week for 28 days.    Dispense:  2 mL    Refill:  0   DISCONTD: Semaglutide-Weight Management 1.7 MG/0.75ML SOAJ    Sig: Inject 1.7 mg into the skin once a week for 28 days.    Dispense:  3 mL    Refill:  0   DISCONTD: Semaglutide-Weight Management 2.4 MG/0.75ML SOAJ    Sig: Inject 2.4 mg into the skin once a week for 28 days.    Dispense:  3 mL    Refill:  0    No follow-ups on file.    This visit occurred during the SARS-CoV-2 public health emergency.  Safety protocols were in place, including screening questions prior to the visit, additional usage of staff PPE, and extensive cleaning of exam room while observing appropriate contact time as indicated for disinfecting solutions.

## 2023-12-08 NOTE — Assessment & Plan Note (Signed)
 She has done quite well with semaglutide however compounding pharmacy is discontinuing this.  Tirzepatide is available however she is not sure if she can afford to continue this.  Will see if her insurance will cover [phone].

## 2023-12-12 ENCOUNTER — Ambulatory Visit: Payer: Self-pay

## 2023-12-12 ENCOUNTER — Ambulatory Visit: Payer: BC Managed Care – PPO | Admitting: Family Medicine

## 2023-12-12 NOTE — Telephone Encounter (Signed)
 Called CAL to inquire about this medication question to see if patient needs to change anything with this medication regimen/dosage.  Everrett Coombe, DO advised the CAL to inform this RN to tell the patient that these were normal side effects but if the patient was uncomfortable with those side effects she can discontinue the medication.  This RN called the patient back and spoke with her about her PCP's advice.  Patient states that she is going to discontinue the Prednisone.  She said she took one tablet this morning and had two more tablets today to take, two to take tomorrow, and one to take the next day.   She said the flushing of her cheeks was too uncomfortable for her and she was going to discontinue at this time.

## 2023-12-12 NOTE — Telephone Encounter (Signed)
 Noted that pt is going to stop taking med. Nothing further needed.

## 2023-12-12 NOTE — Telephone Encounter (Signed)
 Copied from CRM [id number]. Topic: Clinical - Red Word Triage >> Dec 12, 2023  8:17 AM Dennison Nancy wrote: Red Word that prompted transfer to Nurse Triage:  hot flash in face and cheeks are red predniSONE 2 mg   Chief Complaint: Question about reaction to Prednisone Symptoms: flushed cheeks Frequency: x 2 days Pertinent Negatives: Patient denies Chest pain, difficulty breathing, itching, nausea, vomiting, diarrhea, abdominal pain, fever Disposition: [] ED /[] Urgent Care (no appt availability in office) / [] Appointment(In office/virtual)/ []  [facility]/ [x] Home Care/ [] Refused Recommended Disposition /[] [facility]/ []  Follow-up with PCP Additional Notes: Patient called and advised that she was started on Prednisone for pain and on the second day of taking the Predisone she started feeling hot on cheeks of her face and flushed.  She states that she felt like her heart was racing when she got up and moving around for one brief episode two days ago when her cheeks started to flush.  Patient was concerns and states that she has not felt anything with her heart since that one episode but her cheeks on her face will get flushed each day after taking her Prednisone and gets better throughout the day into the evening and goes away.  Patient just wanted reassurance from her PCP that this was a normal side effect of the Prednisone and make sure that she didn't need to change anything.  She states that she is on 10mg  of Prednisone per her PCP Everrett Coombe, DO. Today is Day 4 of her 6 days she is supposed to take this for.  She denies any chest pain, difficulty breathing, nausea, vomiting, diarrhea, fever, rashes, hives, itching, or abdominal pain. Patient is advised that this message will be sent to her PCP to see if he wants to change anything with this medication. Patient is advised of Care Advice as per protocol and educated on some emergent signs/symptoms to look out for and she is advised  that if she gets worse to go to the Emergency Room.  Reason for Disposition  Palpitations  Answer Assessment - Initial Assessment Questions 1. NAME of MEDICINE: "What medicine(s) are you calling about?"     Prednisone 10mg  2. QUESTION: "What is your question?" (e.g., double dose of medicine, side effect)     On the 2nd day patient's cheeks on her face were getting flushed and feeling hot 3. PRESCRIBER: "Who prescribed the medicine?" Reason: if prescribed by specialist, call should be referred to that group.     PCP Everrett Coombe DO 4. SYMPTOMS: "Do you have any symptoms?" If Yes, ask: "What symptoms are you having?"  "How bad are the symptoms (e.g., mild, moderate, severe)     Flushed/warm cheeks 5. PREGNANCY:  "Is there any chance that you are pregnant?" "When was your last menstrual period?"     No  Answer Assessment - Initial Assessment Questions 1. DESCRIPTION: "Please describe your heart rate or heartbeat that you are having" (e.g., fast/slow, regular/irregular, skipped or extra beats, "palpitations")     Racing 2 days ago 2. ONSET: "When did it start?" (Minutes, hours or days)      2 days 3. DURATION: "How long does it last" (e.g., seconds, minutes, hours)     Only when up moving around--pt states she had known that "Prednisone can make people hyper" 4. PATTERN "Does it come and go, or has it been constant since it started?"  "Does it get worse with exertion?"   "Are you feeling it now?"  Hasn't felt it since the first episode two days ago 5. TAP: "Using your hand, can you tap out what you are feeling on a chair or table in front of you, so that I can hear?" (Note: not all patients can do this)       N/a 6. HEART RATE: "Can you tell me your heart rate?" "How many beats in 15 seconds?"  (Note: not all patients can do this)       N/a 7. RECURRENT SYMPTOM: "Have you ever had this before?" If Yes, ask: "When was the last time?" and "What happened that time?"      no 8. CAUSE:  "What do you think is causing the palpitations?"     "Prednisone" 9. CARDIAC HISTORY: "Do you have any history of heart disease?" (e.g., heart attack, angina, bypass surgery, angioplasty, arrhythmia)      No 10. OTHER SYMPTOMS: "Do you have any other symptoms?" (e.g., dizziness, chest pain, sweating, difficulty breathing)       Cheeks on face flushed/hot 11. PREGNANCY: "Is there any chance you are pregnant?" "When was your last menstrual period?"       No  Protocols used: Medication Question Call-A-AH, Heart Rate and Heartbeat Questions-A-AH

## 2023-12-22 ENCOUNTER — Ambulatory Visit: Admitting: Family Medicine

## 2023-12-22 ENCOUNTER — Ambulatory Visit

## 2023-12-22 ENCOUNTER — Encounter: Payer: Self-pay | Admitting: Family Medicine

## 2023-12-22 ENCOUNTER — Ambulatory Visit: Payer: Self-pay | Admitting: *Deleted

## 2023-12-22 VITALS — BP 112/79 | HR 88 | Ht 63.0 in | Wt 210.0 lb

## 2023-12-22 DIAGNOSIS — R222 Localized swelling, mass and lump, trunk: Secondary | ICD-10-CM

## 2023-12-22 DIAGNOSIS — T148XXA Other injury of unspecified body region, initial encounter: Secondary | ICD-10-CM | POA: Diagnosis not present

## 2023-12-22 DIAGNOSIS — R1909 Other intra-abdominal and pelvic swelling, mass and lump: Secondary | ICD-10-CM | POA: Diagnosis not present

## 2023-12-22 NOTE — Telephone Encounter (Signed)
  Chief Complaint: small skin lump Symptoms: URQ- small lump-pes sized, sore to touch, bruise in area Frequency: 3 weeks Pertinent Negatives: Patient denies itching Disposition: [] ED /[] Urgent Care (no appt availability in office) / [x] Appointment(In office/virtual)/ []  [facility]/ [] Home Care/ [] Refused Recommended Disposition /[] [facility]/ []  Follow-up with PCP Additional Notes: Patient is concerned that area is not going away.      Copied From CRM [id number]. Reason for Triage: Patient bruise on the abdomen been there for a few weeks. Sometimes can feel a lump there, the lump is not apparent when standing, but once lying on her back she can feel its tender to the touch at times. She is not sure if its from medication and needs to find out from medical team what should be her next step. She is very concerned.   Reason for Disposition  [1] Small swelling or lump AND [2] unexplained AND [3] present > 1 week  Answer Assessment - Initial Assessment Questions 1. APPEARANCE of SWELLING: "What does it look like?"     Bruise- stared as black- now yellowish in color 2. SIZE: "How large is the swelling?" (e.g., inches, cm; or compare to size of pinhead, tip of pen, eraser, coin, pea, grape, ping pong ball)      Smaller than area- hard to palpate at times 3. LOCATION: "Where is the swelling located?"     Upper left abdomen 4. ONSET: "When did the swelling start?"     3 weeks 5. COLOR: "What color is it?" "Is there more than one color?"     yellowish 6. PAIN: "Is there any pain?" If Yes, ask: "How bad is the pain?" (e.g., scale 1-10; or mild, moderate, severe)     - NONE (0): no pain   - MILD (1-3): doesn't interfere with normal activities    - MODERATE (4-7): interferes with normal activities or awakens from sleep    - SEVERE (8-10): excruciating pain, unable to do any normal activities     Tender- area feels tender 7. ITCH: "Does it itch?" If Yes, ask: "How bad is the  itch?"      no 8. CAUSE: "What do you think caused the swelling?" 9 OTHER SYMPTOMS: "Do you have any other symptoms?" (e.g., fever)     no  Protocols used: Skin Lump or Localized Swelling-A-AH

## 2023-12-22 NOTE — Assessment & Plan Note (Signed)
 Small nodules along the abdominal wall consistent with small lipomas.  Ultrasound abdomen ordered.

## 2023-12-22 NOTE — Progress Notes (Signed)
 Pieces central if you know if you phone for 5 seconds you can listen no do not do all that is good like the menu bar and look up passports June 1 Alexandra Greer - 53 y.o. female MRN [id number]  Date of birth: 02-May-1971  Subjective Chief Complaint  Patient presents with   Bleeding/Bruising    HPI Alexandra Greer is a 53 y.o. female here today with complaint of bruising on the abdomen as well as nodule on the abdominal wall.  Nodule is tender at times.  This is not over the injection site where she gives herself GLP-1 injection.  Does not recall any injury to the abdomen.  No bruising elsewhere, nosebleeds or gum bleeding.  ROS:  A comprehensive ROS was completed and negative except as noted per HPI  Allergies  Allergen Reactions   Nickel Hives, Itching and Rash   Topiramate Other (See Comments)    Past Medical History:  Diagnosis Date   Anxiety    Asthma    Colon polyps    Constipation    Depression    Hypothyroid    Joint pain    Low thyroid stimulating hormone (TSH) level     Past Surgical History:  Procedure Laterality Date   ABDOMINAL HYSTERECTOMY     BREAST SURGERY Left    lumpectomy    Social History   Socioeconomic History   Marital status: Married    Spouse name: Not on file   Number of children: Not on file   Years of education: Not on file   Highest education level: Some college, no degree  Occupational History   Not on file  Tobacco Use   Smoking status: Every Day    Current packs/day: 0.30    Average packs/day: 0.3 packs/day for 25.0 years (7.5 ttl pk-yrs)    Types: Cigarettes    Passive exposure: Current   Smokeless tobacco: Never  Vaping Use   Vaping status: Never Used  Substance and Sexual Activity   Alcohol use: Never   Drug use: Never   Sexual activity: Yes    Partners: Male    Birth control/protection: Surgical  Other Topics Concern   Not on file  Social History Narrative   Not on file   Social Drivers of Health   Financial  Resource Strain: Low Risk  (12/01/2023)   Overall Financial Resource Strain (CARDIA)    Difficulty of Paying Living Expenses: Not hard at all  Food Insecurity: No Food Insecurity (12/01/2023)   Hunger Vital Sign    Worried About Running Out of Food in the Last Year: Never true    Ran Out of Food in the Last Year: Never true  Transportation Needs: No Transportation Needs (12/01/2023)   PRAPARE - Administrator, Civil Service (Medical): No    Lack of Transportation (Non-Medical): No  Physical Activity: Insufficiently Active (12/01/2023)   Exercise Vital Sign    Days of Exercise per Week: 3 days    Minutes of Exercise per Session: 20 min  Stress: No Stress Concern Present (12/01/2023)   [employer] of Occupational Health - Occupational Stress Questionnaire    Feeling of Stress : Only a little  Social Connections: Socially Integrated (12/01/2023)   Social Connection and Isolation Panel [NHANES]    Frequency of Communication with Friends and Family: More than three times a week    Frequency of Social Gatherings with Friends and Family: Twice a week    Attends Religious Services: More than 4 times  per year    Active Member of Clubs or Organizations: Yes    Attends Engineer, structural: More than 4 times per year    Marital Status: Married    Family History  Problem Relation Age of Onset   Hypertension Mother    Hypothyroidism Mother    Dementia Mother    Atrial fibrillation Father     Health Maintenance  Topic Date Due   COVID-19 Vaccine (1) Never done   HIV Screening  Never done   Hepatitis C Screening  Never done   Pneumococcal Vaccine 29-65 Years old (1 of 2 - PCV) Never done   Zoster Vaccines- Shingrix (1 of 2) Never done   INFLUENZA VACCINE  04/09/2024   MAMMOGRAM  11/17/2024   Colonoscopy  02/16/2025   DTaP/Tdap/Td (2 - Td or Tdap) 10/18/2029   HPV VACCINES  Aged Out   Meningococcal B Vaccine  Aged Out      ----------------------------------------------------------------------------------------------------------------------------------------------------------------------------------------------------------------- Physical Exam BP 112/79 (BP Location: Left Arm, Patient Position: Sitting, Cuff Size: Normal)   Pulse 88   Ht 5\' 3"  (1.6 m)   Wt 210 lb (95.3 kg)   SpO2 99%   BMI 37.20 kg/m   Physical Exam Constitutional:      Appearance: Normal appearance.  HENT:     Head: Normocephalic and atraumatic.  Eyes:     General: No scleral icterus. Cardiovascular:     Rate and Rhythm: Normal rate and regular rhythm.  Abdominal:     Comments: Small nontender nodules along the abdomen.  Musculoskeletal:     Cervical back: Neck supple.  Neurological:     Mental Status: She is alert.  Psychiatric:        Mood and Affect: Mood normal.        Behavior: Behavior normal.     ------------------------------------------------------------------------------------------------------------------------------------------------------------------------------------------------------------------- Assessment and Plan  Bruising Checking CMP and CBC.  Abdominal wall mass Small nodules along the abdominal wall consistent with small lipomas.  Ultrasound abdomen ordered.   No orders of the defined types were placed in this encounter.   No follow-ups on file.

## 2023-12-22 NOTE — Telephone Encounter (Signed)
 Patient scheduled today with DR. Augustus Ledger

## 2023-12-22 NOTE — Assessment & Plan Note (Signed)
Checking CMP and CBC 

## 2023-12-23 LAB — CBC WITH DIFFERENTIAL/PLATELET
Basophils Absolute: 0.1 10*3/uL (ref 0.0–0.2)
Basos: 1 %
EOS (ABSOLUTE): 0.3 10*3/uL (ref 0.0–0.4)
Eos: 5 %
Hematocrit: 46.3 % (ref 34.0–46.6)
Hemoglobin: 15.7 g/dL (ref 11.1–15.9)
Immature Grans (Abs): 0 10*3/uL (ref 0.0–0.1)
Immature Granulocytes: 0 %
Lymphocytes Absolute: 1 10*3/uL (ref 0.7–3.1)
Lymphs: 14 %
MCH: 30.7 pg (ref 26.6–33.0)
MCHC: 33.9 g/dL (ref 31.5–35.7)
MCV: 90 fL (ref 79–97)
Monocytes Absolute: 0.7 10*3/uL (ref 0.1–0.9)
Monocytes: 9 %
Neutrophils Absolute: 5.1 10*3/uL (ref 1.4–7.0)
Neutrophils: 71 %
Platelets: 220 10*3/uL (ref 150–450)
RBC: 5.12 x10E6/uL (ref 3.77–5.28)
RDW: 12.4 % (ref 11.7–15.4)
WBC: 7.2 10*3/uL (ref 3.4–10.8)

## 2023-12-23 LAB — CMP14+EGFR
ALT: 36 IU/L — ABNORMAL HIGH (ref 0–32)
AST: 26 IU/L (ref 0–40)
Albumin: 4.7 g/dL (ref 3.8–4.9)
Alkaline Phosphatase: 74 IU/L (ref 44–121)
BUN/Creatinine Ratio: 9 (ref 9–23)
BUN: 10 mg/dL (ref 6–24)
Bilirubin Total: 0.5 mg/dL (ref 0.0–1.2)
CO2: 24 mmol/L (ref 20–29)
Calcium: 9.6 mg/dL (ref 8.7–10.2)
Chloride: 98 mmol/L (ref 96–106)
Creatinine, Ser: 1.13 mg/dL — ABNORMAL HIGH (ref 0.57–1.00)
Globulin, Total: 2.1 g/dL (ref 1.5–4.5)
Glucose: 90 mg/dL (ref 70–99)
Potassium: 4.5 mmol/L (ref 3.5–5.2)
Sodium: 137 mmol/L (ref 134–144)
Total Protein: 6.8 g/dL (ref 6.0–8.5)
eGFR: 57 mL/min/{1.73_m2} — ABNORMAL LOW (ref >59)

## 2023-12-29 ENCOUNTER — Encounter: Payer: Self-pay | Admitting: Family Medicine

## 2023-12-30 ENCOUNTER — Telehealth: Payer: Self-pay

## 2023-12-30 ENCOUNTER — Other Ambulatory Visit (HOSPITAL_COMMUNITY): Payer: Self-pay

## 2023-12-30 NOTE — Telephone Encounter (Signed)
 Pharmacy Patient Advocate Encounter   Received notification from Pt Calls Messages that prior authorization for Wegovy  is required/requested.   Insurance verification completed.   The patient is insured through [facility] .   Per test claim: patient has a plan benefit exclusion. Medication will not be covered.

## 2024-01-02 ENCOUNTER — Encounter: Payer: Self-pay | Admitting: Family Medicine

## 2024-01-15 DIAGNOSIS — R202 Paresthesia of skin: Secondary | ICD-10-CM | POA: Diagnosis not present

## 2024-01-15 DIAGNOSIS — L821 Other seborrheic keratosis: Secondary | ICD-10-CM | POA: Diagnosis not present

## 2024-01-15 DIAGNOSIS — D1801 Hemangioma of skin and subcutaneous tissue: Secondary | ICD-10-CM | POA: Diagnosis not present

## 2024-01-15 DIAGNOSIS — L814 Other melanin hyperpigmentation: Secondary | ICD-10-CM | POA: Diagnosis not present

## 2024-02-17 ENCOUNTER — Encounter: Payer: Self-pay | Admitting: Family Medicine

## 2024-02-17 NOTE — Telephone Encounter (Signed)
 I think this one can wait until Alexandra Greer gets back on Thursday.

## 2024-02-18 ENCOUNTER — Other Ambulatory Visit: Payer: Self-pay | Admitting: Family Medicine

## 2024-02-18 MED ORDER — TIRZEPATIDE 10 MG/0.5ML ~~LOC~~ SOAJ
SUBCUTANEOUS | 5 refills | Status: DC
Start: 2024-02-18 — End: 2024-02-18

## 2024-02-18 MED ORDER — TIRZEPATIDE 10 MG/0.5ML ~~LOC~~ SOAJ: SUBCUTANEOUS | 5 refills | Status: DC

## 2024-02-27 ENCOUNTER — Other Ambulatory Visit: Payer: Self-pay | Admitting: Family Medicine

## 2024-02-28 ENCOUNTER — Other Ambulatory Visit: Payer: Self-pay | Admitting: Family Medicine

## 2024-03-24 ENCOUNTER — Other Ambulatory Visit: Payer: Self-pay | Admitting: Family Medicine

## 2024-04-22 DIAGNOSIS — Z5181 Encounter for therapeutic drug level monitoring: Secondary | ICD-10-CM | POA: Diagnosis not present

## 2024-04-22 DIAGNOSIS — L732 Hidradenitis suppurativa: Secondary | ICD-10-CM | POA: Diagnosis not present

## 2024-05-11 ENCOUNTER — Encounter: Payer: Self-pay | Admitting: Sports Medicine

## 2024-05-11 DIAGNOSIS — Z01818 Encounter for other preprocedural examination: Secondary | ICD-10-CM | POA: Diagnosis not present

## 2024-06-03 ENCOUNTER — Ambulatory Visit: Admission: EM | Admit: 2024-06-03 | Discharge: 2024-06-03 | Disposition: A | Discharge status: Home / Self Care

## 2024-06-03 DIAGNOSIS — S40262A Insect bite (nonvenomous) of left shoulder, initial encounter: Secondary | ICD-10-CM | POA: Diagnosis not present

## 2024-06-03 DIAGNOSIS — W57XXXA Bitten or stung by nonvenomous insect and other nonvenomous arthropods, initial encounter: Secondary | ICD-10-CM

## 2024-06-03 MED ORDER — MUPIROCIN 2 % EX OINT: 1.0000 | TOPICAL_OINTMENT | Freq: Two times a day (BID) | CUTANEOUS | 0 refills | Status: AC

## 2024-06-03 NOTE — ED Triage Notes (Signed)
 Pt c/o red swollen,tender and sore bruise on left shoulder. She noticed it this morning. No home intervention

## 2024-06-03 NOTE — ED Provider Notes (Signed)
 [facility]    CSN: [id number] Arrival date & time: 06/03/24  1301      History   Chief Complaint No chief complaint on file.   HPI Alexandra Greer is a 53 y.o. female.   Patient presents with concern of small area of redness and swelling to left posterior shoulder that appeared this morning. Denies injury to the area. Reports that she got bit by a mosquito in that area about 2-3 months ago but the area never seemed to full go away. It started to become red and more swollen this morning. Denies fever or chills. She is currently taking Dapsone for hidradenitis.      Past Medical History:  Diagnosis Date   Anxiety    Asthma    Colon polyps    Constipation    Depression    Hypothyroid    Joint pain    Low thyroid  stimulating hormone (TSH) level     Patient Active Problem List   Diagnosis Date Noted   Bruising 12/22/2023   Abdominal wall mass 12/22/2023   Left wrist pain 12/08/2023   Insomnia 08/14/2023   Grief 02/12/2023   Neck muscle spasm 08/13/2022   Abnormal weight gain 09/13/2021   Class 3 severe obesity due to excess calories without serious comorbidity with body mass index (BMI) of 40.0 to 44.9 in adult 09/13/2021   Anxiety 07/10/2021   Family history of colon cancer in father 02/10/2020   H/O: hysterectomy 02/10/2020   Former smoker 02/10/2020   Lumbar spondylosis 02/10/2020   Disc disease, degenerative, cervical 02/10/2020   Mild intermittent asthma without complication 02/10/2020   Mild obstructive sleep apnea 10/28/2018   Subcutaneous nodule of breast 06/20/2016   Hypothyroidism 09/12/2011    Past Surgical History:  Procedure Laterality Date   ABDOMINAL HYSTERECTOMY     BREAST SURGERY Left    lumpectomy    OB History     Gravida  2   Para  2   Term  2   Preterm      AB      Living  2      SAB      IAB      Ectopic      Multiple      Live Births  2            Home Medications    Prior to Admission  medications   Medication Sig Start Date End Date Taking? Authorizing Provider  ALPRAZolam  (XANAX ) 0.25 MG tablet TAKE 0.5-1 TABLETS (0.125-0.25 MG TOTAL) BY MOUTH DAILY AS NEEDED FOR ANXIETY. 03/24/24  Yes Alvia Bring, DO  buPROPion (WELLBUTRIN XL) 150 MG 24 hr tablet Take 150 mg by mouth daily. 07/15/21  Yes [provider]  clindamycin (CLINDAGEL) 1 % gel Apply to active lesions twice daily as needed 10/15/23  Yes [provider]  dapsone 25 MG tablet Take 25 mg by mouth daily. 05/24/24 08/22/24 Yes [provider]  levothyroxine  (SYNTHROID ) 100 MCG tablet Take 1 tablet (100 mcg total) by mouth daily before breakfast. 02/10/20  Yes Alexander, Natalie, DO  methocarbamol  (ROBAXIN -750) 750 MG tablet Take 1 tablet (750 mg total) by mouth every 8 (eight) hours as needed for muscle spasms. 11/23/23  Yes Mecum, Erin E, PA-C  mupirocin  ointment (BACTROBAN ) 2 % Apply 1 Application topically 2 (two) times daily. 06/03/24  Yes Gabryel Talamo, Darryle E, FNP  nystatin (MYCOSTATIN/NYSTOP) powder APPLY TO AFFECTED AREA TWICE A DAY 10/01/21  Yes [provider]  silver sulfADIAZINE (SILVADENE) 1 % cream Apply to inflamed affected spots daily as needed 10/15/23  Yes [provider]  spironolactone (ALDACTONE) 50 MG tablet Take 1 tablet by mouth daily. 10/15/23 10/14/24 Yes [provider]  albuterol  (VENTOLIN  HFA) 108 (90 Base) MCG/ACT inhaler INHALE 2 PUFFS BY MOUTH EVERY 4 HOURS AS NEEDED FOR WHEEZE OR FOR SHORTNESS OF BREATH 03/01/24   Alvia Bring, DO  AMBULATORY NON FORMULARY MEDICATION Semaglutide  2.5 mg/mL + Vit B6 10mg /mL.    Inject 10 units/0.25 mg subcutaneous weekly for 4 weeks then 20 units/0.5 mg subcutaneous weekly for 4 weeks, then 40 units/1 mg weekly. Dispense 2mL. Patient not taking: Reported on 06/03/2024 05/15/23   Alvia Bring, DO  Azelastine -Fluticasone  (DYMISTA ) 137-50 MCG/ACT SUSP Place 1 spray into the nose every 12 (twelve) hours. 11/08/22 12/08/22  Joesph Shaver Scales, PA-C  budesonide -formoterol  (SYMBICORT ) 160-4.5 MCG/ACT inhaler Inhale 2 puffs into the lungs 2 (two) times daily. 05/14/22   [provider]  moxifloxacin (AVELOX) 400 MG tablet Take 400 mg by mouth daily. Patient not taking: Reported on 06/03/2024    [provider]  predniSONE  (STERAPRED UNI-PAK 21 TAB) 10 MG (21) TBPK tablet Taper as directed on packaging. 12/08/23   Alvia Bring, DO  tirzepatide  (MOUNJARO ) 10 MG/0.5ML Pen Liposlim.  Tirzepatide /Pyridoxine/Thiamine/L-Carnitine 10mg /mL.  Inject 2.5 mg/25 units subcu weekly for 4 weeks then 5 mg/50 units subcu weekly for 4 weeks then 7.5 mg/75 units subcu weekly for 4 weeks then 10 mg/100 units subcu weekly for 4 weeks then 15 mg/150 units subcu weekly Patient not taking: Reported on 06/03/2024 02/18/24   Alvia Bring, DO  traZODone  (DESYREL ) 50 MG tablet TAKE 1 TO 2 TABLETS BY MOUTH AT BEDTIME AS NEEDED FOR SLEEP Patient not taking: Reported on 06/03/2024 03/01/24   Alvia Bring, DO  budesonide  (PULMICORT ) 0.5 MG/2ML nebulizer solution Take 2 mLs (0.5 mg total) by nebulization 2 (two) times daily as needed. 03/14/20 12/03/20  Oris Camie BRAVO, NP    Family History Family History  Problem Relation Age of Onset   Hypertension Mother    Hypothyroidism Mother    Dementia Mother    Atrial fibrillation Father     Social History Social History   Tobacco Use   Smoking status: Former    Current packs/day: 0.30    Average packs/day: 0.3 packs/day for 25.0 years (7.5 ttl pk-yrs)    Types: Cigarettes    Passive exposure: Past   Smokeless tobacco: Never  Vaping Use   Vaping status: Never Used  Substance Use Topics   Alcohol use: Never   Drug use: Never     Allergies   Nickel and Topiramate   Review of Systems Review of Systems Per HPI  Physical Exam Triage Vital Signs ED Triage Vitals  Encounter Vitals Group     BP 06/03/24 1315 126/84     Girls Systolic BP Percentile --      Girls Diastolic BP  Percentile --      Boys Systolic BP Percentile --      Boys Diastolic BP Percentile --      Pulse Rate 06/03/24 1315 66     Resp 06/03/24 1315 20     Temp 06/03/24 1315 98 F (36.7 C)     Temp src --      SpO2 06/03/24 1315 96 %     Weight --      Height --      Head Circumference --      Peak  Flow --      Pain Score 06/03/24 1310 0     Pain Loc --      Pain Education --      Exclude from Growth Chart --    No data found.  Updated Vital Signs BP 126/84 (BP Location: Right Arm)   Pulse 66   Temp 98 F (36.7 C)   Resp 20   SpO2 96%   Visual Acuity Right Eye Distance:   Left Eye Distance:   Bilateral Distance:    Right Eye Near:   Left Eye Near:    Bilateral Near:     Physical Exam Constitutional:      General: She is not in acute distress.    Appearance: Normal appearance. She is not toxic-appearing or diaphoretic.  HENT:     Head: Normocephalic and atraumatic.  Eyes:     Extraocular Movements: Extraocular movements intact.     Conjunctiva/sclera: Conjunctivae normal.  Pulmonary:     Effort: Pulmonary effort is normal.  Skin:    Comments: Approximately 2 cm area of redness with 0.5 cm area of centralized induration present to left posterior shoulder. No drainage noted.   Neurological:     General: No focal deficit present.     Mental Status: She is alert and oriented to person, place, and time. Mental status is at baseline.  Psychiatric:        Mood and Affect: Mood normal.        Behavior: Behavior normal.        Thought Content: Thought content normal.        Judgment: Judgment normal.      UC Treatments / Results  Labs (all labs ordered are listed, but only abnormal results are displayed) Labs Reviewed - No data to display  EKG   Radiology No results found.  Procedures Procedures (including critical care time)  Medications Ordered in UC Medications - No data to display  Initial Impression / Assessment and Plan / UC Course  I have  reviewed the triage vital signs and the nursing notes.  Pertinent labs & imaging results that were available during my care of the patient were reviewed by me and considered in my medical decision making (see chart for details).     Physical exam is consistent with insect or spider bite. Patient reports mosquito bite to that area multiple months ago that is now inflamed. There is concern for possible start of infection. Although, area is small so will opt to treat with topical antibiotic, mupirocin . She was advised to use cool compresses as well. Advised patient to monitor closely and follow up if it persists or worsens. No systemic symptoms seem to be present at this time. Patient verbalized understanding and was agreeable with plan.  Final Clinical Impressions(s) / UC Diagnoses   Final diagnoses:  Insect bite of left shoulder, initial encounter     Discharge Instructions      I have prescribed a topical antibiotic. Use cool compresses as we discussed. Follow up if it persists or worsens.     ED Prescriptions     Medication Sig Dispense Auth. Provider   mupirocin  ointment (BACTROBAN ) 2 % Apply 1 Application topically 2 (two) times daily. 22 g Hazen Darryle BRAVO, [place]      PDMP not reviewed this encounter.   Hazen Darryle BRAVO, [place] 06/03/24 [phone]

## 2024-06-03 NOTE — Discharge Instructions (Signed)
 I have prescribed a topical antibiotic. Use cool compresses as we discussed. Follow up if it persists or worsens.

## 2024-07-12 ENCOUNTER — Ambulatory Visit
Admission: EM | Admit: 2024-07-12 | Discharge: 2024-07-12 | Disposition: A | Discharge status: Home / Self Care | Attending: Family Medicine | Admitting: Family Medicine

## 2024-07-12 ENCOUNTER — Other Ambulatory Visit: Payer: Self-pay

## 2024-07-12 DIAGNOSIS — J329 Chronic sinusitis, unspecified: Secondary | ICD-10-CM | POA: Diagnosis not present

## 2024-07-12 DIAGNOSIS — J40 Bronchitis, not specified as acute or chronic: Secondary | ICD-10-CM

## 2024-07-12 MED ORDER — AMOXICILLIN-POT CLAVULANATE 875-125 MG PO TABS: 1.0000 | ORAL_TABLET | Freq: Two times a day (BID) | ORAL | 0 refills | Status: DC

## 2024-07-12 MED ORDER — PROMETHAZINE-DM 6.25-15 MG/5ML PO SYRP: 5.0000 mL | ORAL_SOLUTION | Freq: Four times a day (QID) | ORAL | 0 refills | Status: AC | PRN

## 2024-07-12 NOTE — ED Provider Notes (Signed)
 [facility]    CSN: [id number] Arrival date & time: 07/12/24  1348      History   Chief Complaint Chief Complaint  Patient presents with   Nasal Congestion   Cough   Headache   Facial Pain    HPI Alexandra Greer is a 53 y.o. female.   The history is provided by the patient.  Cough Associated symptoms: headaches   Headache Associated symptoms: cough   Not feeling well for 2 weeks symptoms include nasal congestion, postnasal drip, sinus pain or pressure, headache, discomfort in her neck and upper back, cough with occasional sputum.  Admits wheezing worse at night feels short of breath with coughing jags.  Has a history of asthma has been using her inhaler more than normal.  Denies fever, chills, body aches, fatigue, chest pain, back pain, abdominal pain, nausea, vomiting, diarrhea.  Denies recent travel or known COVID or flu exposures.  Husband has had similar symptoms.  Past Medical History:  Diagnosis Date   Anxiety    Asthma    Colon polyps    Constipation    Depression    Hypothyroid    Joint pain    Low thyroid  stimulating hormone (TSH) level     Patient Active Problem List   Diagnosis Date Noted   Bruising 12/22/2023   Abdominal wall mass 12/22/2023   Left wrist pain 12/08/2023   Insomnia 08/14/2023   Grief 02/12/2023   Neck muscle spasm 08/13/2022   Abnormal weight gain 09/13/2021   Class 3 severe obesity due to excess calories without serious comorbidity with body mass index (BMI) of 40.0 to 44.9 in adult [facility]) 09/13/2021   Anxiety 07/10/2021   Family history of colon cancer in father 02/10/2020   H/O: hysterectomy 02/10/2020   Former smoker 02/10/2020   Lumbar spondylosis 02/10/2020   Disc disease, degenerative, cervical 02/10/2020   Mild intermittent asthma without complication 02/10/2020   Mild obstructive sleep apnea 10/28/2018   Subcutaneous nodule of breast 06/20/2016   Hypothyroidism 09/12/2011    Past Surgical History:  Procedure  Laterality Date   ABDOMINAL HYSTERECTOMY     BREAST SURGERY Left    lumpectomy    OB History     Gravida  2   Para  2   Term  2   Preterm      AB      Living  2      SAB      IAB      Ectopic      Multiple      Live Births  2            Home Medications    Prior to Admission medications   Medication Sig Start Date End Date Taking? Authorizing Provider  amoxicillin -clavulanate (AUGMENTIN ) 875-125 MG tablet Take 1 tablet by mouth every 12 (twelve) hours. 07/12/24  Yes Glynnis Gavel, PA  promethazine-dextromethorphan (PROMETHAZINE-DM) 6.25-15 MG/5ML syrup Take 5 mLs by mouth 4 (four) times daily as needed for up to 6 days for cough. 07/12/24 07/18/24 Yes Sandhya Denherder, PA  albuterol  (VENTOLIN  HFA) 108 (90 Base) MCG/ACT inhaler INHALE 2 PUFFS BY MOUTH EVERY 4 HOURS AS NEEDED FOR WHEEZE OR FOR SHORTNESS OF BREATH 03/01/24   Alvia Bring, DO  ALPRAZolam  (XANAX ) 0.25 MG tablet TAKE 0.5-1 TABLETS (0.125-0.25 MG TOTAL) BY MOUTH DAILY AS NEEDED FOR ANXIETY. 03/24/24   Alvia Bring, DO  AMBULATORY NON FORMULARY MEDICATION Semaglutide  2.5 mg/mL + Vit B6 10mg /mL.    Inject  10 units/0.25 mg subcutaneous weekly for 4 weeks then 20 units/0.5 mg subcutaneous weekly for 4 weeks, then 40 units/1 mg weekly. Dispense 2mL. Patient not taking: Reported on 06/03/2024 05/15/23   Alvia Bring, DO  buPROPion (WELLBUTRIN XL) 150 MG 24 hr tablet Take 150 mg by mouth daily. 07/15/21   [provider]  clindamycin (CLINDAGEL) 1 % gel Apply to active lesions twice daily as needed 10/15/23   [provider]  dapsone 25 MG tablet Take 25 mg by mouth daily. 05/24/24 08/22/24  [provider]  levothyroxine  (SYNTHROID ) 100 MCG tablet Take 1 tablet (100 mcg total) by mouth daily before breakfast. 02/10/20   Alexander, Natalie, DO  mupirocin  ointment (BACTROBAN ) 2 % Apply 1 Application topically 2 (two) times daily. 06/03/24   Hazen Darryle BRAVO, FNP  nystatin (MYCOSTATIN/NYSTOP)  powder APPLY TO AFFECTED AREA TWICE A DAY 10/01/21   [provider]  silver sulfADIAZINE (SILVADENE) 1 % cream Apply to inflamed affected spots daily as needed 10/15/23   [provider]  spironolactone (ALDACTONE) 50 MG tablet Take 1 tablet by mouth daily. 10/15/23 10/14/24  [provider]  budesonide  (PULMICORT ) 0.5 MG/2ML nebulizer solution Take 2 mLs (0.5 mg total) by nebulization 2 (two) times daily as needed. 03/14/20 12/03/20  Oris Camie BRAVO, NP    Family History Family History  Problem Relation Age of Onset   Hypertension Mother    Hypothyroidism Mother    Dementia Mother    Atrial fibrillation Father     Social History Social History   Tobacco Use   Smoking status: Former    Current packs/day: 0.30    Average packs/day: 0.3 packs/day for 25.0 years (7.5 ttl pk-yrs)    Types: Cigarettes    Passive exposure: Past   Smokeless tobacco: Never  Vaping Use   Vaping status: Some Days  Substance Use Topics   Alcohol use: Never   Drug use: Never     Allergies   Nickel and Topiramate   Review of Systems Review of Systems  Respiratory:  Positive for cough.   Neurological:  Positive for headaches.     Physical Exam Triage Vital Signs ED Triage Vitals  Encounter Vitals Group     BP 07/12/24 1401 (!) 144/88     Girls Systolic BP Percentile --      Girls Diastolic BP Percentile --      Boys Systolic BP Percentile --      Boys Diastolic BP Percentile --      Pulse Rate 07/12/24 1401 85     Resp 07/12/24 1401 20     Temp 07/12/24 1401 98 F (36.7 C)     Temp Source 07/12/24 1401 Oral     SpO2 07/12/24 1401 94 %     Weight --      Height --      Head Circumference --      Peak Flow --      Pain Score 07/12/24 1357 7     Pain Loc --      Pain Education --      Exclude from Growth Chart --    No data found.  Updated Vital Signs BP (!) 144/88 (BP Location: Right Arm)   Pulse 85   Temp 98 F (36.7 C) (Oral)   Resp 20   SpO2 94%    Visual Acuity Right Eye Distance:   Left Eye Distance:   Bilateral Distance:    Right Eye Near:   Left  Eye Near:    Bilateral Near:     Physical Exam Vitals and nursing note reviewed.  Constitutional:      Appearance: She is not ill-appearing.  HENT:     Head: Normocephalic and atraumatic.     Nose: Congestion present.     Right Sinus: Maxillary sinus tenderness present. No frontal sinus tenderness.     Left Sinus: Maxillary sinus tenderness present. No frontal sinus tenderness.  Eyes:     Extraocular Movements: Extraocular movements intact.  Cardiovascular:     Rate and Rhythm: Normal rate and regular rhythm.     Heart sounds:     No friction rub.  Pulmonary:     Effort: Pulmonary effort is normal. No respiratory distress.     Breath sounds: Normal breath sounds. No wheezing or rales.  Musculoskeletal:     Cervical back: Normal range of motion and neck supple.  Lymphadenopathy:     Cervical: No cervical adenopathy.  Skin:    General: Skin is warm.  Neurological:     Mental Status: She is alert.  Psychiatric:        Mood and Affect: Mood normal.      UC Treatments / Results  Labs (all labs ordered are listed, but only abnormal results are displayed) Labs Reviewed - No data to display  EKG   Radiology No results found.  Procedures Procedures (including critical care time)  Medications Ordered in UC Medications - No data to display  Initial Impression / Assessment and Plan / UC Course  I have reviewed the triage vital signs and the nursing notes.  Pertinent labs & imaging results that were available during my care of the patient were reviewed by me and considered in my medical decision making (see chart for details).    53 year old female with history of mild asthma presents with cough sinus congestion for greater than 2 2 weeks with increased need for use of her inhaler.  She is mildly hypertensive 144/88 otherwise vital signs are normal. She is  well-appearing has mild sinus congestion and tenderness on exam, lungs are clear to auscultation.  Will treat with course of antibiotics for sinusitis, home care follow-up warning signs discussed with patient she is well-appearing has mild sinus tenderness on exam lungs are clear to auscultation without rales rhonchi or wheezing. Final Clinical Impressions(s) / UC Diagnoses   Final diagnoses:  Sinobronchitis     Discharge Instructions      Continue using your albuterol  inhaler as needed for wheezing See your doctor if no improvement in 1 week.  Go to the emergency department if you develop new symptoms such as high fever, shaking chills, chest pain or shortness of breath   ED Prescriptions     Medication Sig Dispense Auth. Provider   amoxicillin -clavulanate (AUGMENTIN ) 875-125 MG tablet Take 1 tablet by mouth every 12 (twelve) hours. 14 tablet Deone Leifheit, PA   promethazine-dextromethorphan (PROMETHAZINE-DM) 6.25-15 MG/5ML syrup Take 5 mLs by mouth 4 (four) times daily as needed for up to 6 days for cough. 118 mL Concepcion Kirkpatrick, [place]      PDMP not reviewed this encounter.   Damyon Mullane, [place] 07/12/24 1421

## 2024-07-12 NOTE — ED Triage Notes (Signed)
 Pt states she hasn't been feeling good for about 2 weeks. Pt states her husband came home and she thinks she caught it from him. Pt states that she has chest congestion, sinus congestion and pain, HA, neck pain, and cough. Pt denies any fevers. Pt states she took day time medicine that temporarily helped but by nighttime she would be hurting again. Pt states last took cold medicine on Friday and has only done Tylenol since then.

## 2024-07-12 NOTE — Discharge Instructions (Signed)
 Continue using your albuterol  inhaler as needed for wheezing See your doctor if no improvement in 1 week.  Go to the emergency department if you develop new symptoms such as high fever, shaking chills, chest pain or shortness of breath

## 2024-07-30 DIAGNOSIS — L720 Epidermal cyst: Secondary | ICD-10-CM | POA: Diagnosis not present

## 2024-08-06 DIAGNOSIS — Z01419 Encounter for gynecological examination (general) (routine) without abnormal findings: Secondary | ICD-10-CM | POA: Diagnosis not present

## 2024-08-06 DIAGNOSIS — Z8742 Personal history of other diseases of the female genital tract: Secondary | ICD-10-CM | POA: Diagnosis not present

## 2024-08-10 ENCOUNTER — Ambulatory Visit

## 2024-08-10 ENCOUNTER — Ambulatory Visit: Admitting: Family Medicine

## 2024-08-10 ENCOUNTER — Encounter: Payer: Self-pay | Admitting: Family Medicine

## 2024-08-10 VITALS — BP 125/86 | HR 62 | Ht 63.0 in | Wt 221.0 lb

## 2024-08-10 DIAGNOSIS — M5412 Radiculopathy, cervical region: Secondary | ICD-10-CM | POA: Insufficient documentation

## 2024-08-10 DIAGNOSIS — M47812 Spondylosis without myelopathy or radiculopathy, cervical region: Secondary | ICD-10-CM | POA: Diagnosis not present

## 2024-08-10 DIAGNOSIS — R200 Anesthesia of skin: Secondary | ICD-10-CM | POA: Diagnosis not present

## 2024-08-10 DIAGNOSIS — M4802 Spinal stenosis, cervical region: Secondary | ICD-10-CM | POA: Diagnosis not present

## 2024-08-10 DIAGNOSIS — M25511 Pain in right shoulder: Secondary | ICD-10-CM | POA: Diagnosis not present

## 2024-08-10 MED ORDER — PREDNISONE 10 MG (21) PO TBPK: ORAL_TABLET | ORAL | 0 refills | Status: AC

## 2024-08-10 MED ORDER — METHOCARBAMOL 500 MG PO TABS: 500.0000 mg | ORAL_TABLET | Freq: Three times a day (TID) | ORAL | 0 refills | Status: AC

## 2024-08-10 MED ORDER — ZEPBOUND 5 MG/0.5ML ~~LOC~~ SOAJ: 5.0000 mg | SUBCUTANEOUS | 0 refills | Status: DC

## 2024-08-10 NOTE — Assessment & Plan Note (Signed)
 X-rays of cervical spine ordered.  Adding prednisone  taper along with Robaxin  as needed.  Referral placed to physical therapy.  Red flags reviewed.

## 2024-08-10 NOTE — Patient Instructions (Signed)
 Cervical Radiculopathy  Cervical radiculopathy means that a nerve in the neck (a cervical nerve) is pinched or bruised. This can happen because of an injury to the cervical spine (vertebrae) in the neck, or as a normal part of getting older. This condition can cause pain or loss of feeling (numbness) that runs from your neck all the way down to your arm and fingers. Often, this condition gets better with rest. Treatment may be needed if the condition does not get better. What are the causes? A neck injury. A bulging disk in your spine. Sudden muscle tightening (muscle spasms). Tight muscles in your neck due to overuse. Arthritis. Breakdown in the bones and joints of the spine (spondylosis) due to getting older. Bone spurs that form near the nerves in the neck. What are the signs or symptoms? Pain. The pain may: Run from the neck to the arm and hand. Be very bad or irritating. Get worse when you move your neck. Loss of feeling or tingling in your arm or hand. Weakness in your arm or hand, in very bad cases. How is this treated? In many cases, treatment is not needed for this condition. With rest, the condition often gets better over time. If treatment is needed, options may include: Wearing a soft neck collar (cervical collar) for short periods of time. Doing exercises (physical therapy) to strengthen your neck muscles. Taking medicines. Having shots (injections) in your spine, in very bad cases. Having surgery. This may be needed if other treatments do not help. The type of surgery that is used will depend on the cause of your condition. Follow these instructions at home: If you have a soft neck collar: Wear it as told by your doctor. Take it off only as told by your doctor. Ask your doctor if you can take the collar off for cleaning and bathing. If you are allowed to take the collar off for cleaning or bathing: Follow instructions from your doctor about how to take off the collar  safely. Clean the collar by wiping it with mild soap and water and drying it completely. Take out any removable pads in the collar every 1-2 days. Wash them by hand with soap and water. Let them air-dry completely before you put them back in the collar. Check your skin under the collar for redness or sores. If you see any, tell your doctor. Managing pain     Take over-the-counter and prescription medicines only as told by your doctor. If told, put ice on the painful area. To do this: If you have a soft neck collar, take if off as told by your doctor. Put ice in a plastic bag. Place a towel between your skin and the bag. Leave the ice on for 20 minutes, 2-3 times a day. Take off the ice if your skin turns bright red. This is very important. If you cannot feel pain, heat, or cold, you have a greater risk of damage to the area. If using ice does not help, you can try using heat. Use the heat source that your doctor recommends, such as a moist heat pack or a heating pad. Place a towel between your skin and the heat source. Leave the heat on for 20-30 minutes. Take off the heat if your skin turns bright red. This is very important. If you cannot feel pain, heat, or cold, you have a greater risk of getting burned. You may try a gentle neck and shoulder rub (massage). Activity Rest as needed. Return  to your normal activities when your doctor says that it is safe. Do exercises as told by your doctor or physical therapist. You may have to avoid lifting. Ask your doctor how much you can safely lift. General instructions Use a flat pillow when you sleep. Do not drive while wearing a soft neck collar. If you do not have a soft neck collar, ask your doctor if it is safe to drive while your neck heals. Ask your doctor if you should avoid driving or using machines while you are taking your medicine. Do not smoke or use any products that contain nicotine or tobacco. If you need help quitting, ask your  doctor. Keep all follow-up visits. Contact a doctor if: Your condition does not get better with treatment. Get help right away if: Your pain gets worse and medicine does not help. You lose feeling or feel weak in your hand, arm, face, or leg. You have a high fever. Your neck is stiff. You cannot control when you poop or pee (have incontinence). You have trouble with walking, balance, or talking. Summary Cervical radiculopathy means that a nerve in the neck is pinched or bruised. A nerve can get pinched from a bulging disk, arthritis, an injury to the neck, or other causes. Symptoms include pain, tingling, or loss of feeling that goes from the neck to the arm or hand. Weakness in your arm or hand can happen in very bad cases. Treatment may include resting, wearing a soft neck collar, and doing exercises. You might need to take medicines for pain. In very bad cases, shots or surgery may be needed. This information is not intended to replace advice given to you by your health care provider. Make sure you discuss any questions you have with your health care provider. Document Revised: 03/01/2021 Document Reviewed: 03/01/2021 Elsevier Patient Education  2024 [employer].

## 2024-08-10 NOTE — Addendum Note (Signed)
 Addended by: Jemal Miskell E on: 08/10/2024 10:29 PM   Modules accepted: Orders

## 2024-08-10 NOTE — Progress Notes (Signed)
 Alexandra Greer - 53 y.o. female MRN [id number]  Date of birth: 1971-06-12  Subjective Chief Complaint  Patient presents with   Shoulder Pain    HPI Alexandra Greer is a 53 year old female here today with complaint of right neck and shoulder pain.  Symptoms started about a week ago.  Radiates into the right shoulder and down the arm.  She does have some numbness and tingling associated with this.  She does feel some weakness at times.  Has tried over-the-counter anti-inflammatories as well as heat and ice without much improvement.  ROS:  A comprehensive ROS was completed and negative except as noted per HPI  Allergies  Allergen Reactions   Nickel Hives, Itching and Rash   Topiramate Other (See Comments)    Past Medical History:  Diagnosis Date   Allergy As a child   Allergic to nickel   Anxiety    Arthritis 2012   Asthma    Colon polyps    Constipation    Depression    Hypothyroid    Joint pain    Low thyroid  stimulating hormone (TSH) level     Past Surgical History:  Procedure Laterality Date   ABDOMINAL HYSTERECTOMY     APPENDECTOMY  2014   Removed when i had hysterectomy   BREAST SURGERY Left    lumpectomy    Social History   Socioeconomic History   Marital status: Married    Spouse name: Not on file   Number of children: Not on file   Years of education: Not on file   Highest education level: Some college, no degree  Occupational History   Not on file  Tobacco Use   Smoking status: Former    Current packs/day: 0.30    Average packs/day: 0.3 packs/day for 25.0 years (7.5 ttl pk-yrs)    Types: Cigarettes    Passive exposure: Past   Smokeless tobacco: Never  Vaping Use   Vaping status: Some Days  Substance and Sexual Activity   Alcohol use: Never   Drug use: Never   Sexual activity: Yes    Partners: Male    Birth control/protection: Surgical  Other Topics Concern   Not on file  Social History Narrative   Not on file   Social Drivers of  Health   Financial Resource Strain: Low Risk  (08/09/2024)   Overall Financial Resource Strain (CARDIA)    Difficulty of Paying Living Expenses: Not hard at all  Food Insecurity: Patient Declined (08/09/2024)   Hunger Vital Sign    Worried About Running Out of Food in the Last Year: Patient declined    Ran Out of Food in the Last Year: Patient declined  Transportation Needs: No Transportation Needs (08/09/2024)   PRAPARE - Administrator, Civil Service (Medical): No    Lack of Transportation (Non-Medical): No  Physical Activity: Unknown (08/09/2024)   Exercise Vital Sign    Days of Exercise per Week: Patient declined    Minutes of Exercise per Session: Not on file  Stress: Stress Concern Present (08/09/2024)   [employer] of Occupational Health - Occupational Stress Questionnaire    Feeling of Stress: To some extent  Social Connections: Unknown (08/09/2024)   Social Connection and Isolation Panel    Frequency of Communication with Friends and Family: More than three times a week    Frequency of Social Gatherings with Friends and Family: More than three times a week    Attends Religious Services: 1 to 4 times per year  Active Member of Clubs or Organizations: Patient declined    Attends Banker Meetings: Not on file    Marital Status: Married    Family History  Problem Relation Age of Onset   Hypertension Mother    Hypothyroidism Mother    Dementia Mother    Arthritis Mother    Diabetes Mother    Hearing loss Mother    Obesity Mother    Atrial fibrillation Father    Arthritis Father    Cancer Father    Hearing loss Father    Hypertension Father     Health Maintenance  Topic Date Due   HIV Screening  Never done   Hepatitis C Screening  Never done   Zoster Vaccines- Shingrix (1 of 2) 11/08/2024 (Originally 10/26/1987)   Influenza Vaccine  12/07/2024 (Originally 04/09/2024)   Pneumococcal Vaccine: 50+ Years (1 of 2 - PCV) 08/10/2025 (Originally  10/26/1987)   Hepatitis B Vaccines 19-59 Average Risk (1 of 3 - 19+ 3-dose series) 08/10/2025 (Originally 10/26/1987)   COVID-19 Vaccine (1) 08/26/2025 (Originally 10/25/1973)   Mammogram  11/17/2024   Colonoscopy  02/16/2025   DTaP/Tdap/Td (2 - Td or Tdap) 10/18/2029   HPV VACCINES  Aged Out   Meningococcal B Vaccine  Aged Out     ----------------------------------------------------------------------------------------------------------------------------------------------------------------------------------------------------------------- Physical Exam BP 125/86 (BP Location: Left Arm, Patient Position: Sitting, Cuff Size: Normal)   Pulse 62   Ht 5' 3 (1.6 m)   Wt 221 lb (100.2 kg)   SpO2 97%   BMI 39.15 kg/m   Physical Exam Constitutional:      Appearance: Normal appearance.  HENT:     Head: Normocephalic and atraumatic.  Neck:     Comments: Increased pain with neck extension. Musculoskeletal:     Comments: Mild weakness in grip strength on the right compared to the left.  Strength otherwise in the upper extremity is normal.  She does have a positive Spurling's test on the right.  Neurological:     Mental Status: She is alert.  Psychiatric:        Mood and Affect: Mood normal.        Behavior: Behavior normal.     ------------------------------------------------------------------------------------------------------------------------------------------------------------------------------------------------------------------- Assessment and Plan  Cervical radiculitis X-rays of cervical spine ordered.  Adding prednisone  taper along with Robaxin  as needed.  Referral placed to physical therapy.  Red flags reviewed.   Meds ordered this encounter  Medications   methocarbamol  (ROBAXIN ) 500 MG tablet    Sig: Take 1 tablet (500 mg total) by mouth 3 (three) times daily.    Dispense:  90 tablet    Refill:  0   predniSONE  (STERAPRED UNI-PAK 21 TAB) 10 MG (21) TBPK tablet    Sig:  Taper as directed on packaging.    Dispense:  21 tablet    Refill:  0    No follow-ups on file.

## 2024-08-11 ENCOUNTER — Other Ambulatory Visit: Payer: Self-pay | Admitting: Family Medicine

## 2024-08-11 NOTE — Therapy (Unsigned)
 OUTPATIENT PHYSICAL THERAPY CERVICAL EVALUATION   Patient Name: Alexandra Greer MRN: [id number] DOB:[birth year], 53 y.o., female Today's Date: 08/23/2024  END OF SESSION:    Past Medical History:  Diagnosis Date   Allergy As a child   Allergic to nickel   Anxiety    Arthritis 2012   Asthma    Colon polyps    Constipation    Depression    Hypothyroid    Joint pain    Low thyroid  stimulating hormone (TSH) level    Past Surgical History:  Procedure Laterality Date   ABDOMINAL HYSTERECTOMY     APPENDECTOMY  2014   Removed when i had hysterectomy   BREAST SURGERY Left    lumpectomy   Patient Active Problem List   Diagnosis Date Noted   Cervical radiculitis 08/10/2024   Epidermal inclusion cyst 07/30/2024   Hidradenitis suppurativa 04/22/2024   Left wrist pain 12/08/2023   Insomnia 08/14/2023   Grief 02/12/2023   Neck muscle spasm 08/13/2022   Abnormal weight gain 09/13/2021   Class 3 severe obesity due to excess calories without serious comorbidity with body mass index (BMI) of 40.0 to 44.9 in adult [facility]) 09/13/2021   Anxiety 07/10/2021   Family history of colon cancer in father 02/10/2020   H/O: hysterectomy 02/10/2020   Former smoker 02/10/2020   Lumbar spondylosis 02/10/2020   Disc disease, degenerative, cervical 02/10/2020   Mild intermittent asthma without complication 02/10/2020   Mild obstructive sleep apnea 10/28/2018   Subcutaneous nodule of breast 06/20/2016   Hypothyroidism 09/12/2011    PCP: Dr Velma Ku  REFERRING PROVIDER: Dr Velma Ku  REFERRING DIAG: Cervical radiculitis   THERAPY DIAG:  Radiculopathy, cervical region - Plan: PT plan of care cert/re-cert  Abnormal posture - Plan: PT plan of care cert/re-cert  Other symptoms and signs involving the musculoskeletal system - Plan: PT plan of care cert/re-cert  Rationale for Evaluation and Treatment: Rehabilitation  ONSET DATE: 07/29/24  SUBJECTIVE:                                                                                                                                                                                                          SUBJECTIVE STATEMENT: Patient reports onset of R neck and shoulder pain over the past 1-2 weeks with symptoms radiating into the R UE with numbness and tingling as well as some weakness at times. Patient does not known of any accident or injury but does remember that she had congestion and deep coughing due to bronchitis. She has started on prednisone  with some  improvement in muscle spasms which were occurring between shoulder blades. She continues to have some numbness and tingling in the R hand.     Hand dominance: Right  PERTINENT HISTORY:  Anxiety; depression; arthritis; joint pain; hysterectomy; asthma; appendectomy; lumpectomy Patient had neck problems including DDD which was treated ~ 6 years ago.  Cervical dysfunction problems started in 2012 with HA and neck pain. She has been treated with PT a few times over the years  She has a history of LBP as well.   PAIN:  Are you having pain? Yes: NPRS scale: 4-5/10 Pain location: R neck to R shoulder area  Pain description: burning with extension; aching  Aggravating factors: using R arm  Relieving factors: not using R arm; medication; lying on R side   PRECAUTIONS: None  RED FLAGS: None     WEIGHT BEARING RESTRICTIONS: No  FALLS:  Has patient fallen in last 6 months? 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  DIAGNOSTIC FINDINGS:  Xray cervical spine 08/10/24 - no results available   PATIENT SURVEYS:  NDI 30/50; 60%   COGNITION: Overall cognitive status: Within functional limits for tasks assessed  SENSATION: Numbness and tingling R shoulder; ring around biceps area of arm; extensor forearm; whole hand   POSTURE: rounded shoulders, forward head, increased thoracic kyphosis, and flexed trunk     PALPATION: Tightness ant/lat/posterior cervical musculature; upper trap; leveator; pecs;  Decreased mobility thoracic spine   CERVICAL ROM:   Active ROM A/PROM (deg) eval  Flexion 35 pull R upper trap  Extension 29 pull R upper trap  Right lateral flexion 27 pain R upper trap  Left lateral flexion 24 pain R upper trap   Right rotation 33 pain R upper trap   Left rotation 48    (Blank rows = not tested)  UPPER EXTREMITY ROM: WFL's - tight end ranges elevation   Active ROM Right eval Left eval  Shoulder flexion    Shoulder extension    Shoulder abduction    Shoulder adduction    Shoulder extension    Shoulder internal rotation    Shoulder external rotation    Elbow flexion    Elbow extension    Wrist flexion    Wrist extension    Wrist ulnar deviation    Wrist radial deviation    Wrist pronation    Wrist supination     (Blank rows = not tested)  UPPER EXTREMITY MMT:  MMT Right eval Left eval  Shoulder flexion 5 5  Shoulder extension 5 5  Shoulder abduction 5 5  Shoulder adduction    Shoulder extension    Shoulder internal rotation    Shoulder external rotation    Middle trapezius 4 4  Lower trapezius 4 4  Elbow flexion 5 5  Elbow extension    Wrist flexion    Wrist extension 5 5  Wrist ulnar deviation    Wrist radial deviation  Wrist pronation    Wrist supination    Grip strength WFL's  WFL's   (Blank rows = not tested)  CERVICAL SPECIAL  TESTS:  Upper limb tension test (ULTT): Positive, Spurling's test: Negative, and Distraction test: Positive   TREATMENT DATE: evaluation findings; POC; HEP ;postural correction                                                                                                                                  PATIENT EDUCATION:  Education details: POC; HEP  Person educated: Patient Education method: Explanation, Demonstration, Tactile cues, Verbal cues, and Handouts Education comprehension: verbalized understanding, returned demonstration, verbal cues required, tactile cues required, and needs further education  HOME EXERCISE PROGRAM: Access Code: [email or web address] URL: [email or web address] Date: 08/12/2024 Prepared by: Grantham Hippert  Exercises - Seated Cervical Retraction  - 2 x daily - 7 x weekly - 1-2 sets - 5-10 reps - 10 sec  hold - Seated Scapular Retraction  - 2 x daily - 7 x weekly - 1-2 sets - 10 reps - 10 sec  hold - Doorway Pec Stretch at 60 Degrees Abduction  - 3 x daily - 7 x weekly - 1 sets - 3 reps - Doorway Pec Stretch at 90 Degrees Abduction  - 3 x daily - 7 x weekly - 1 sets - 3 reps - 30 seconds  hold - Doorway Pec Stretch at 120 Degrees Abduction  - 3 x daily - 7 x weekly - 1 sets - 3 reps - 30 second hold  hold - Standing Infraspinatus/Teres Minor Release with Ball at Wall  - 2 x daily - 7 x weekly - Standing Pectoral Release with Ball at Wall  - 3-4 x daily - 7 x weekly  ASSESSMENT:  CLINICAL IMPRESSION: Patient is a 53 y.o. female who was seen today for physical therapy evaluation and treatment for cervical radiculitis. She reports onset of symptoms ~ 2 weeks ago with no known injury but she does relate that she has been deep coughing for the past month due to bronchitis. She has also been sleeping in her recliner due to bronchitis. Patient has poor posture and alignment; limited cervical ROM, mobility; pain and radicular symptoms with cervical motions; muscular  tightness cervical and thoracic musculature; limited positional tolerance; intermittent numbness, tingling R UE shoulder to hand with hand in glove like pattern. Patient has a history of cervical radiculopathy over the past 12 + years with episodes of flare up treated with physical therapy including dry needling. She also has a history of lumbar dysfunction including LBP. Patient reports sedentary lifestyle. She will benefit from PT to address problems identified.   OBJECTIVE IMPAIRMENTS: decreased activity tolerance, decreased mobility, decreased ROM, increased fascial restrictions, increased muscle spasms, impaired UE functional use, improper body mechanics, postural dysfunction, obesity, and pain.   ACTIVITY LIMITATIONS: carrying, lifting, bending, sitting, standing, sleeping, transfers, and reach over head  PARTICIPATION LIMITATIONS: meal prep,  cleaning, shopping, community activity, occupation, and yard work  PERSONAL FACTORS: Fitness, Past/current experiences, Time since onset of injury/illness/exacerbation, and comorbidities as noted are also affecting patient's functional outcome.   REHAB POTENTIAL: Good  CLINICAL DECISION MAKING: Evolving/moderate complexity  EVALUATION COMPLEXITY: Moderate   GOALS: Goals reviewed with patient? Yes  SHORT TERM GOALS: Target date: 09/09/2024   Independent in initial HEP  Baseline:  Goal status: INITIAL  2.  Decrease R UE radicular symptoms by 50-70%  Baseline:  Goal status: INITIAL  3.  Improve posture and alignment with patient to demonstrate activation of posterior shoudler girdle musculature and improved cervical alignment  Baseline:  Goal status: INITIAL   LONG TERM GOALS: Target date: 10/07/2024   Decrease pain in cervical and shoulder girdle area as well as R UE by 75% allowing patient to return to normal functional activities  Baseline:  Goal status: INITIAL  2.  Increase cervical ROM by 5-8 deg in lateral flexion and rotation  allowing patient to check traffic more easily when driving  Baseline:  Goal status: INITIAL  3.  Patient reports ability to sleep 4-6 hours in her bed instead of recliner  Baseline:  Goal status: INITIAL  4.  Patient reports and demonstrates proper postures and positions for ADL's  Baseline:  Goal status: INITIAL  5.  Improve NDI by 10% indicating improve functional abilities  Baseline: NDI 30/50; 60% Goal status: INITIAL  6.  Independent in HEP  Baseline:  Goal status: INITIAL   PLAN:  PT FREQUENCY: 2x/week  PT DURATION: 8 weeks  PLANNED INTERVENTIONS: 97164- PT Re-evaluation, 97110-Therapeutic exercises, 97530- Therapeutic activity, 97112- Neuromuscular re-education, 97535- Self Care, [id number]- Manual therapy, Patient/Family education, Taping, and Joint mobilization; mechanical traction; dry needling   PLAN FOR NEXT SESSION: review and progress exercises; continue with spine care and ergonomic education and correction as indicated; manual work and modalities as indicated    [employer], PT 08/23/2024, 2:08 PM

## 2024-08-12 ENCOUNTER — Other Ambulatory Visit: Payer: Self-pay

## 2024-08-12 ENCOUNTER — Ambulatory Visit: Attending: Family Medicine | Admitting: Rehabilitative and Restorative Service Providers"

## 2024-08-12 ENCOUNTER — Encounter: Payer: Self-pay | Admitting: Rehabilitative and Restorative Service Providers"

## 2024-08-12 DIAGNOSIS — R293 Abnormal posture: Secondary | ICD-10-CM | POA: Insufficient documentation

## 2024-08-12 DIAGNOSIS — M5412 Radiculopathy, cervical region: Secondary | ICD-10-CM | POA: Diagnosis not present

## 2024-08-12 DIAGNOSIS — R29898 Other symptoms and signs involving the musculoskeletal system: Secondary | ICD-10-CM | POA: Insufficient documentation

## 2024-08-13 ENCOUNTER — Encounter: Payer: Self-pay | Admitting: Family Medicine

## 2024-08-15 MED ORDER — TIRZEPATIDE 10 MG/0.5ML ~~LOC~~ SOAJ: SUBCUTANEOUS | 6 refills | Status: AC

## 2024-08-15 NOTE — Addendum Note (Signed)
 Addended by: Kristofer Schaffert E on: 08/15/2024 09:20 PM   Modules accepted: Orders

## 2024-08-16 ENCOUNTER — Encounter: Payer: Self-pay | Admitting: Family Medicine

## 2024-08-17 ENCOUNTER — Telehealth: Payer: Self-pay

## 2024-08-17 NOTE — Telephone Encounter (Signed)
 The message  regarding excuse for jury duty-shows that it was received yesterday 08/16/2024 and forwarded for Dr. Alvia.  He has not yet had time to respond to the request.

## 2024-08-17 NOTE — Telephone Encounter (Signed)
 Copied from CRM [id number]. Topic: General - Other >> Aug 16, 2024  3:20 PM Delon DASEN wrote: Reason for CRM: checking on message sent to Dr Alvia in [place]

## 2024-08-18 ENCOUNTER — Encounter: Payer: Self-pay | Admitting: Family Medicine

## 2024-08-18 NOTE — Telephone Encounter (Signed)
 Letter has been written and submitted to Dr. Alvia for approval.

## 2024-08-20 ENCOUNTER — Ambulatory Visit: Payer: Self-pay | Admitting: Family Medicine

## 2024-08-20 NOTE — Telephone Encounter (Signed)
 Last read by Grayce Boone at 2:53PM on 08/20/2024.

## 2024-08-23 ENCOUNTER — Ambulatory Visit

## 2024-08-23 DIAGNOSIS — M5412 Radiculopathy, cervical region: Secondary | ICD-10-CM

## 2024-08-23 DIAGNOSIS — R293 Abnormal posture: Secondary | ICD-10-CM

## 2024-08-23 DIAGNOSIS — R29898 Other symptoms and signs involving the musculoskeletal system: Secondary | ICD-10-CM

## 2024-08-23 NOTE — Addendum Note (Signed)
 Addended by: INA FLORENE SQUIBB on: 08/23/2024 02:11 PM   Modules accepted: Orders

## 2024-08-23 NOTE — Therapy (Signed)
 OUTPATIENT PHYSICAL THERAPY CERVICAL TREATMENT   Patient Name: Alexandra Greer MRN: [id number] DOB:04-11-1971, 53 y.o., female Today's Date: 08/23/2024  END OF SESSION:  PT End of Session - 08/23/24 1312     Visit Number 2    Number of Visits 16    Date for Recertification  10/07/24    Authorization Type bcbs    PT Start Time 1315    PT Stop Time 1356    PT Time Calculation (min) 41 min    Activity Tolerance Patient tolerated treatment well           Past Medical History:  Diagnosis Date   Allergy As a child   Allergic to nickel   Anxiety    Arthritis 2012   Asthma    Colon polyps    Constipation    Depression    Hypothyroid    Joint pain    Low thyroid  stimulating hormone (TSH) level    Past Surgical History:  Procedure Laterality Date   ABDOMINAL HYSTERECTOMY     APPENDECTOMY  2014   Removed when i had hysterectomy   BREAST SURGERY Left    lumpectomy   Patient Active Problem List   Diagnosis Date Noted   Cervical radiculitis 08/10/2024   Epidermal inclusion cyst 07/30/2024   Hidradenitis suppurativa 04/22/2024   Left wrist pain 12/08/2023   Insomnia 08/14/2023   Grief 02/12/2023   Neck muscle spasm 08/13/2022   Abnormal weight gain 09/13/2021   Class 3 severe obesity due to excess calories without serious comorbidity with body mass index (BMI) of 40.0 to 44.9 in adult [facility]) 09/13/2021   Anxiety 07/10/2021   Family history of colon cancer in father 02/10/2020   H/O: hysterectomy 02/10/2020   Former smoker 02/10/2020   Lumbar spondylosis 02/10/2020   Disc disease, degenerative, cervical 02/10/2020   Mild intermittent asthma without complication 02/10/2020   Mild obstructive sleep apnea 10/28/2018   Subcutaneous nodule of breast 06/20/2016   Hypothyroidism 09/12/2011    PCP: Dr Velma Ku  REFERRING PROVIDER: Dr Velma Ku  REFERRING DIAG: Cervical radiculitis   THERAPY DIAG:  Radiculopathy, cervical region  Abnormal  posture  Other symptoms and signs involving the musculoskeletal system  Rationale for Evaluation and Treatment: Rehabilitation  ONSET DATE: 07/29/24  SUBJECTIVE:                                                                                                                                                                                                         SUBJECTIVE STATEMENT: I feel better than the last time  I was here. She reports not having a muscle spasm for about 1.5 weeks. Feels like the prednisone  has helped. She reports HEP is going well. When she does cervical retraction this causes the RUE to go numb.   EVAL: Patient reports onset of R neck and shoulder pain over the past 1-2 weeks with symptoms radiating into the R UE with numbness and tingling as well as some weakness at times. Patient does not known of any accident or injury but does remember that she had congestion and deep coughing due to bronchitis. She has started on prednisone  with some improvement in muscle spasms which were occurring between shoulder blades. She continues to have some numbness and tingling in the R hand.     Hand dominance: Right  PERTINENT HISTORY:  Anxiety; depression; arthritis; joint pain; hysterectomy; asthma; appendectomy; lumpectomy Patient had neck problems including DDD which was treated ~ 6 years ago.  Cervical dysfunction problems started in 2012 with HA and neck pain. She has been treated with PT a few times over the years  She has a history of LBP as well.   PAIN:  Are you having pain? Yes: NPRS scale: 3-4/10 Pain location: R neck to R shoulder area  Pain description: burning with extension; aching  Aggravating factors: using R arm  Relieving factors: not using R arm; medication; lying on R side   PRECAUTIONS: None  RED FLAGS: None     WEIGHT BEARING RESTRICTIONS: No  FALLS:  Has patient fallen in last 6 months? 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  DIAGNOSTIC FINDINGS:  Xray cervical spine 08/10/24 - no results available   PATIENT SURVEYS:  NDI 30/50; 60%   COGNITION: Overall cognitive status: Within functional limits for tasks assessed  SENSATION: Numbness and tingling R shoulder; ring around biceps area of arm; extensor forearm; whole hand   POSTURE: rounded shoulders, forward head, increased thoracic kyphosis, and flexed trunk     PALPATION: Tightness ant/lat/posterior cervical musculature; upper trap; leveator; pecs;  Decreased mobility thoracic spine   CERVICAL ROM:   Active ROM A/PROM (deg) eval  Flexion 35 pull R upper trap  Extension 29 pull R upper trap  Right lateral flexion 27 pain R upper trap  Left lateral flexion 24 pain R upper trap   Right rotation 33 pain R upper trap   Left rotation 48    (Blank rows = not tested)  UPPER EXTREMITY ROM: WFL's - tight end ranges elevation   Active ROM Right eval Left eval  Shoulder flexion    Shoulder extension    Shoulder abduction    Shoulder adduction    Shoulder extension    Shoulder internal rotation    Shoulder external rotation    Elbow flexion    Elbow extension    Wrist flexion    Wrist extension    Wrist ulnar deviation    Wrist radial deviation    Wrist  pronation    Wrist supination     (Blank rows = not tested)  UPPER EXTREMITY MMT:  MMT Right eval Left eval  Shoulder flexion 5 5   Shoulder extension 5 5  Shoulder abduction 5 5  Shoulder adduction    Shoulder extension    Shoulder internal rotation    Shoulder external rotation    Middle trapezius 4 4  Lower trapezius 4 4  Elbow flexion 5 5  Elbow extension    Wrist flexion    Wrist extension 5 5  Wrist ulnar deviation    Wrist radial deviation    Wrist pronation    Wrist supination    Grip strength WFL's  WFL's   (Blank rows = not tested)  CERVICAL SPECIAL TESTS:  Upper limb tension test (ULTT): Positive, Spurling's test: Negative, and Distraction test: Positive   [facility] Adult PT Treatment:                                                DATE: 08/23/24 Therapeutic Exercise: Pec doorway stretch 2 x 30 sec  HEP review and update Manual Therapy: STM Rt upper trap, levator scapulae, posterior rotator cuff, middle trap, rhomboids Suboccipital release Demo and returned demo of use of tennis ball for self-soft tissue mobilization Manual cervical traction- demo and returned demo of self-traction  Neuromuscular re-ed: Seated and supine cervical retraction d/c due to numbness/tingling in RUE  Seated scapular retraction x 10  Supine horizontal shoulder abduction 2 x 10 yellow band     PATIENT EDUCATION:  Education details: HEP update  Person educated: Patient Education method: Explanation, Demonstration, Tactile cues, Verbal cues, and Handouts Education comprehension: verbalized understanding, returned demonstration, verbal cues required, tactile cues required, and needs further education  HOME EXERCISE PROGRAM: Access Code: [email or web address] URL: [email or web address] Date: 08/23/2024 Prepared by: Lucie Meeter  Exercises - Seated Scapular Retraction  - 2 x daily - 7 x weekly - 1-2 sets - 10 reps - 10 sec  hold - Doorway Pec Stretch at 60 Degrees Abduction  - 3 x daily - 7 x weekly - 1 sets - 3 reps - Doorway Pec Stretch at 90 Degrees Abduction  - 3 x daily - 7 x weekly - 1 sets - 3 reps - 30  seconds  hold - Doorway Pec Stretch at 120 Degrees Abduction  - 3 x daily - 7 x weekly - 1 sets - 3 reps - 30 second hold  hold - Standing Infraspinatus/Teres Minor Release with Ball at Wall  - 2 x daily - 7 x weekly - Standing Pectoral Release with Ball at [employer]  - 3-4 x daily - 7 x weekly - Supine Suboccipital Release with Tennis Balls  - 1 x daily - 7 x weekly - 2 min hold - Hooklying Neck Distraction and Traction  - 1 x daily - 7 x weekly - 1 sets - 10 reps - 5 sec  hold  ASSESSMENT:  CLINICAL IMPRESSION: Reviewed HEP, discontinuing cervical retraction as this caused numbness and tingling in the RUE in both seated and supine. Mild tautness and palpable tenderness present about Rt suboccipitals and infraspinatus with partial release from manual therapy. She responded well to manual cervical distraction stating that this felt good. Added self distraction technique in supine to HEP. Light progression of postural strengthening today with good tolerance without onset of  pain. No pain at conclusion of session.   EVAL: Patient is a 53 y.o. female who was seen today for physical therapy evaluation and treatment for cervical radiculitis. She reports onset of symptoms ~ 2 weeks ago with no known injury but she does relate that she has been deep coughing for the past month due to bronchitis. She has also been sleeping in her recliner due to bronchitis. Patient has poor posture and alignment; limited cervical ROM, mobility; pain and radicular symptoms with cervical motions; muscular tightness cervical and thoracic musculature; limited positional tolerance; intermittent numbness, tingling R UE shoulder to hand with hand in glove like pattern. Patient has a history of cervical radiculopathy over the past 12 + years with episodes of flare up treated with physical therapy including dry needling. She also has a history of lumbar dysfunction including LBP. Patient reports sedentary lifestyle. She will benefit from PT  to address problems identified.   OBJECTIVE IMPAIRMENTS: decreased activity tolerance, decreased mobility, decreased ROM, increased fascial restrictions, increased muscle spasms, impaired UE functional use, improper body mechanics, postural dysfunction, obesity, and pain.   ACTIVITY LIMITATIONS: carrying, lifting, bending, sitting, standing, sleeping, transfers, and reach over head  PARTICIPATION LIMITATIONS: meal prep, cleaning, shopping, community activity, occupation, and yard work  PERSONAL FACTORS: Fitness, Past/current experiences, Time since onset of injury/illness/exacerbation, and comorbidities as noted are also affecting patient's functional outcome.   REHAB POTENTIAL: Good  CLINICAL DECISION MAKING: Evolving/moderate complexity  EVALUATION COMPLEXITY: Moderate   GOALS: Goals reviewed with patient? Yes  SHORT TERM GOALS: Target date: 09/09/2024   Independent in initial HEP  Baseline:  Goal status: INITIAL  2.  Decrease R UE radicular symptoms by 50-70%  Baseline:  Goal status: INITIAL  3.  Improve posture and alignment with patient to demonstrate activation of posterior shoudler girdle musculature and improved cervical alignment  Baseline:  Goal status: INITIAL   LONG TERM GOALS: Target date: 10/07/2024   Decrease pain in cervical and shoulder girdle area as well as R UE by 75% allowing patient to return to normal functional activities  Baseline:  Goal status: INITIAL  2.  Increase cervical ROM by 5-8 deg in lateral flexion and rotation allowing patient to check traffic more easily when driving  Baseline:  Goal status: INITIAL  3.  Patient reports ability to sleep 4-6 hours in her bed instead of recliner  Baseline:  Goal status: INITIAL  4.  Patient reports and demonstrates proper postures and positions for ADL's  Baseline:  Goal status: INITIAL  5.  Improve NDI by 10% indicating improve functional abilities  Baseline: NDI 30/50; 60% Goal status:  INITIAL  6.  Independent in HEP  Baseline:  Goal status: INITIAL   PLAN:  PT FREQUENCY: 2x/week  PT DURATION: 8 weeks  PLANNED INTERVENTIONS: 97164- PT Re-evaluation, 97110-Therapeutic exercises, 97530- Therapeutic activity, 97112- Neuromuscular re-education, 97535- Self Care, 02859- Manual therapy, Patient/Family education, Taping, and Joint mobilization  PLAN FOR NEXT SESSION: review and progress exercises; continue with spine care and ergonomic education and correction as indicated; manual work and modalities as indicated    Lucie Meeter, PT, DPT, ATC 08/23/2024 1:56 PM

## 2024-08-25 ENCOUNTER — Encounter: Payer: Self-pay | Admitting: Rehabilitative and Restorative Service Providers"

## 2024-08-25 ENCOUNTER — Ambulatory Visit: Admitting: Rehabilitative and Restorative Service Providers"

## 2024-08-25 DIAGNOSIS — R293 Abnormal posture: Secondary | ICD-10-CM | POA: Diagnosis not present

## 2024-08-25 DIAGNOSIS — R29898 Other symptoms and signs involving the musculoskeletal system: Secondary | ICD-10-CM

## 2024-08-25 DIAGNOSIS — M5412 Radiculopathy, cervical region: Secondary | ICD-10-CM | POA: Diagnosis not present

## 2024-08-25 NOTE — Therapy (Signed)
 OUTPATIENT PHYSICAL THERAPY CERVICAL TREATMENT   Patient Name: Alexandra Greer MRN: [id number] DOB:01/24/1971, 53 y.o., female Today's Date: 08/25/2024  END OF SESSION:  PT End of Session - 08/25/24 0936     Visit Number 3    Number of Visits 16    Date for Recertification  10/07/24    Authorization Type bcbs    PT Start Time 0930    PT Stop Time 1020    PT Time Calculation (min) 50 min    Activity Tolerance Patient tolerated treatment well           Past Medical History:  Diagnosis Date   Allergy As a child   Allergic to nickel   Anxiety    Arthritis 2012   Asthma    Colon polyps    Constipation    Depression    Hypothyroid    Joint pain    Low thyroid  stimulating hormone (TSH) level    Past Surgical History:  Procedure Laterality Date   ABDOMINAL HYSTERECTOMY     APPENDECTOMY  2014   Removed when i had hysterectomy   BREAST SURGERY Left    lumpectomy   Patient Active Problem List   Diagnosis Date Noted   Cervical radiculitis 08/10/2024   Epidermal inclusion cyst 07/30/2024   Hidradenitis suppurativa 04/22/2024   Left wrist pain 12/08/2023   Insomnia 08/14/2023   Grief 02/12/2023   Neck muscle spasm 08/13/2022   Abnormal weight gain 09/13/2021   Class 3 severe obesity due to excess calories without serious comorbidity with body mass index (BMI) of 40.0 to 44.9 in adult [facility]) 09/13/2021   Anxiety 07/10/2021   Family history of colon cancer in father 02/10/2020   H/O: hysterectomy 02/10/2020   Former smoker 02/10/2020   Lumbar spondylosis 02/10/2020   Disc disease, degenerative, cervical 02/10/2020   Mild intermittent asthma without complication 02/10/2020   Mild obstructive sleep apnea 10/28/2018   Subcutaneous nodule of breast 06/20/2016   Hypothyroidism 09/12/2011    PCP: Dr Velma Ku  REFERRING PROVIDER: Dr Velma Ku  REFERRING DIAG: Cervical radiculitis   THERAPY DIAG:  Radiculopathy, cervical region  Abnormal  posture  Other symptoms and signs involving the musculoskeletal system  Rationale for Evaluation and Treatment: Rehabilitation  ONSET DATE: 07/29/24  SUBJECTIVE:                                                                                                                                                                                                         SUBJECTIVE STATEMENT: Patient reports that sie is feeling better  than the the first time she was here. She reports not having a muscle spasm and is having less of the tingling and numbness in the R UE. Feels like the prednisone  has helped but she has finished the dose pac. She reports HEP is going well. When she does cervical retraction this causes the RUE to go numb but less than it did. Feels better with less tightness and no radicular numbness or tingling with chin tuck in standing.   EVAL: Patient reports onset of R neck and shoulder pain over the past 1-2 weeks with symptoms radiating into the R UE with numbness and tingling as well as some weakness at times. Patient does not known of any accident or injury but does remember that she had congestion and deep coughing due to bronchitis. She has started on prednisone  with some improvement in muscle spasms which were occurring between shoulder blades. She continues to have some numbness and tingling in the R hand.     Hand dominance: Right  PERTINENT HISTORY:  Anxiety; depression; arthritis; joint pain; hysterectomy; asthma; appendectomy; lumpectomy Patient had neck problems including DDD which was treated ~ 6 years ago.  Cervical dysfunction problems started in 2012 with HA and neck pain. She has been treated with PT a few times over the years  She has a history of LBP as well.   PAIN:  Are you having pain? Yes: NPRS scale: 3-4/10 Pain location: R neck to R shoulder area  Pain description: burning with extension; aching  Aggravating factors: using R arm  Relieving factors: not  using R arm; medication; lying on R side   PRECAUTIONS: None  RED FLAGS: None     WEIGHT BEARING RESTRICTIONS: No  FALLS:  Has patient fallen in last 6 months? 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  DIAGNOSTIC FINDINGS:  Xray cervical spine 08/10/24 - no results available   PATIENT SURVEYS:  NDI 30/50; 60%   SENSATION: Numbness and tingling R shoulder; ring around biceps area of arm; extensor forearm; whole hand   POSTURE: rounded shoulders, forward head, increased thoracic kyphosis, and flexed trunk     PALPATION: Tightness ant/lat/posterior cervical musculature; upper trap; leveator; pecs;  Decreased mobility thoracic spine   CERVICAL ROM:   Active ROM A/PROM (deg) eval  Flexion 35 pull R upper trap  Extension 29 pull R upper trap  Right lateral flexion 27 pain R upper trap  Left lateral flexion 24 pain R upper trap   Right rotation 33 pain R upper trap   Left rotation 48    (Blank rows = not tested)  UPPER EXTREMITY ROM: WFL's - tight end ranges elevation   Active ROM Right eval Left eval  Shoulder flexion    Shoulder extension    Shoulder abduction    Shoulder adduction    Shoulder extension    Shoulder internal rotation    Shoulder external rotation    Elbow flexion  Elbow extension    Wrist flexion    Wrist extension    Wrist ulnar deviation    Wrist radial deviation    Wrist pronation    Wrist  supination     (Blank rows = not tested)  UPPER EXTREMITY MMT:  MMT Right eval Left eval  Shoulder flexion 5 5  Shoulder extension 5 5  Shoulder abduction 5 5  Shoulder adduction    Shoulder extension    Shoulder internal rotation    Shoulder external rotation    Middle trapezius 4 4  Lower trapezius 4 4  Elbow flexion 5 5  Elbow extension    Wrist flexion    Wrist extension 5 5  Wrist ulnar deviation    Wrist radial deviation    Wrist pronation    Wrist supination    Grip strength WFL's  WFL's   (Blank rows = not tested)  CERVICAL SPECIAL TESTS:  Upper limb tension test (ULTT): Positive, Spurling's test: Negative, and Distraction test: Positive   Access Code: [email or web address] URL: [email or web address] Date: 08/25/2024 Prepared by: Aleem Elza  Exercises - Supine Piriformis Stretch with Leg Straight  - 2 x daily - 7 x weekly - 1 sets - 3 reps - 30 sec  hold - Standing Piriformis Release with Ball at [employer]  - 2 x daily - 7 x weekly - 30-60 sec  hold - Supine Transversus Abdominis Bracing with Pelvic Floor Contraction  - 2 x daily - 7 x weekly - 1 sets - 10 reps - 10sec  hold - Hip Flexor Stretch at Edge of Bed  - 2 x daily - 7 x weekly - 1 sets - 3 reps - 30 sec  hold - Sidelying Hip Abduction  - 1 x daily - 7 x weekly - 3 sets - 10 reps - 3-5 sec  hold - Supine Figure 4 Piriformis Stretch  - 1 x daily - 7 x weekly - 1 sets - 3 reps - 30 sec  hold - Anti-Rotation Lateral Stepping with Press  - 2 x daily - 7 x weekly - 1-2 sets - 10 reps - 2-3 sec  hold - Seated Hip Internal Rotation with Ball and Resistance  - 2 x daily - 7 x weekly - 1 sets - 10 reps - 3 sec  hold - Seated Hip Flexor Stretch  - 2 x daily - 7 x weekly - 1 sets - 3 reps - 30 sec  hold - Standing Hip Flexor Stretch  - 1 x daily - 7 x weekly - 1 sets - 3 reps - 30 sec  hold - Standing Hip Flexor Stretch With Overhead Reach and Contralateral Sidebend  - 1 x daily - 7 x weekly - 1 sets - 3 reps - 30 sec   hold   [facility] Adult PT Treatment:                                                DATE: 08/25/24 Therapeutic Exercise: Pec doorway stretch 3 positions 2 x 30 sec   Manual Therapy: Thoracic extension seated with coregeous ball at T-spine - PT assisted extension  STM/TPR Rt > Lt anterior/lateral/ posterior cervical musculature; pecs; upper trap patient supine  Suboccipital release manually Grade II/III mob 1st rib  Use of golf balls for suboccipital release  Manual cervical traction Neural mobilization  R UE 1 min x 2 good improvement in R UE radicular symptoms  Neuromuscular re-ed: Standing scap squeeze with noodle 5 sec x 10 some tingling in R UE initial - improved with reps  Seated cervical retraction to patient tolerance avoiding numbness/tingling in RUE  Supine cervical retraction to patient tolerance avoiding numbness/tingling in RUE  Seated scapular retraction x 10 with coregeous ball  Self care:   Use of golf ball for suboccipital release  Avoid watching TV in bed  Use of pillows for L sidelying in sleep   Work on posture and alignment, evaluating positions in sitting and lying down     [facility] Adult PT Treatment:                                                DATE: 08/23/24 Therapeutic Exercise: Pec doorway stretch 2 x 30 sec  HEP review and update Manual Therapy: STM Rt upper trap, levator scapulae, posterior rotator cuff, middle trap, rhomboids Suboccipital release Demo and returned demo of use of tennis ball for self-soft tissue mobilization Manual cervical traction- demo and returned demo of self-traction  Neuromuscular re-ed: Seated and supine cervical retraction d/c due to numbness/tingling in RUE  Seated scapular retraction x 10  Supine horizontal shoulder abduction 2 x 10 yellow band     PATIENT EDUCATION:  Education details: HEP update  Person educated: Patient Education method: Explanation, Demonstration, Tactile cues, Verbal cues, and Handouts Education  comprehension: verbalized understanding, returned demonstration, verbal cues required, tactile cues required, and needs further education  HOME EXERCISE PROGRAM: Access Code: [email or web address] URL: [email or web address] Date: 08/23/2024 Prepared by: Lucie Meeter  Exercises - Seated Scapular Retraction  - 2 x daily - 7 x weekly - 1-2 sets - 10 reps - 10 sec  hold - Doorway Pec Stretch at 60 Degrees Abduction  - 3 x daily - 7 x weekly - 1 sets - 3 reps - Doorway Pec Stretch at 90 Degrees Abduction  - 3 x daily - 7 x weekly - 1 sets - 3 reps - 30 seconds  hold - Doorway Pec Stretch at 120 Degrees Abduction  - 3 x daily - 7 x weekly - 1 sets - 3 reps - 30 second hold  hold - Standing Infraspinatus/Teres Minor Release with Ball at Wall  - 2 x daily - 7 x weekly - Standing Pectoral Release with Ball at Wall  - 3-4 x daily - 7 x weekly - Supine Suboccipital Release with Tennis Balls  - 1 x daily - 7 x weekly - 2 min hold - Hooklying Neck Distraction and Traction  - 1 x daily - 7 x weekly - 1 sets - 10 reps - 5 sec  hold  ASSESSMENT:  CLINICAL IMPRESSION: Continued treatment consisting of stretching and postural correction. Manual work with patient supine working through the cervical and 1st rib area as well as posterior lateral musculature. Note tightness in the clavicular/1st rib area; scaleni; pecs; posterior cervical musculature into the suboccipital area. Good response to manual work with gradual resolution of R UE radicular symptoms. Discussed postures and positions at home. Patient does watch TV in bed propped on pillows with head turned to R. Suggested she avoid this activity. Resolution of pain and radicular symptoms following treatment.  EVAL: Patient is a 53 y.o. female who was seen today for physical therapy evaluation  and treatment for cervical radiculitis. She reports onset of symptoms ~ 2 weeks ago with no known injury but she does relate that she has been deep coughing for the  past month due to bronchitis. She has also been sleeping in her recliner due to bronchitis. Patient has poor posture and alignment; limited cervical ROM, mobility; pain and radicular symptoms with cervical motions; muscular tightness cervical and thoracic musculature; limited positional tolerance; intermittent numbness, tingling R UE shoulder to hand with hand in glove like pattern. Patient has a history of cervical radiculopathy over the past 12 + years with episodes of flare up treated with physical therapy including dry needling. She also has a history of lumbar dysfunction including LBP. Patient reports sedentary lifestyle. She will benefit from PT to address problems identified.   OBJECTIVE IMPAIRMENTS: decreased activity tolerance, decreased mobility, decreased ROM, increased fascial restrictions, increased muscle spasms, impaired UE functional use, improper body mechanics, postural dysfunction, obesity, and pain.    GOALS: Goals reviewed with patient? Yes  SHORT TERM GOALS: Target date: 09/09/2024   Independent in initial HEP  Baseline:  Goal status: INITIAL  2.  Decrease R UE radicular symptoms by 50-70%  Baseline:  Goal status: INITIAL  3.  Improve posture and alignment with patient to demonstrate activation of posterior shoudler girdle musculature and improved cervical alignment  Baseline:  Goal status: INITIAL   LONG TERM GOALS: Target date: 10/07/2024   Decrease pain in cervical and shoulder girdle area as well as R UE by 75% allowing patient to return to normal functional activities  Baseline:  Goal status: INITIAL  2.  Increase cervical ROM by 5-8 deg in lateral flexion and rotation allowing patient to check traffic more easily when driving  Baseline:  Goal status: INITIAL  3.  Patient reports ability to sleep 4-6 hours in her bed instead of recliner  Baseline:  Goal status: INITIAL  4.  Patient reports and demonstrates proper postures and positions for ADL's   Baseline:  Goal status: INITIAL  5.  Improve NDI by 10% indicating improve functional abilities  Baseline: NDI 30/50; 60% Goal status: INITIAL  6.  Independent in HEP  Baseline:  Goal status: INITIAL   PLAN:  PT FREQUENCY: 2x/week  PT DURATION: 8 weeks  PLANNED INTERVENTIONS: 97164- PT Re-evaluation, 97110-Therapeutic exercises, 97530- Therapeutic activity, 97112- Neuromuscular re-education, 97535- Self Care, [id number]- Manual therapy, Patient/Family education, Taping, and Joint mobilization  PLAN FOR NEXT SESSION: review and progress exercises; continue with spine care and ergonomic education and correction as indicated; manual work and modalities as indicated   Alyze Lauf P. Ina PT, MPH 08/25/2024 10:42 AM

## 2024-08-30 ENCOUNTER — Ambulatory Visit

## 2024-08-30 DIAGNOSIS — M5412 Radiculopathy, cervical region: Secondary | ICD-10-CM

## 2024-08-30 DIAGNOSIS — R293 Abnormal posture: Secondary | ICD-10-CM

## 2024-08-30 DIAGNOSIS — R29898 Other symptoms and signs involving the musculoskeletal system: Secondary | ICD-10-CM | POA: Diagnosis not present

## 2024-08-30 NOTE — Therapy (Signed)
 " OUTPATIENT PHYSICAL THERAPY CERVICAL TREATMENT   Patient Name: Alexandra Greer MRN: [id number] DOB:Apr 03, 1971, 53 y.o., female Today's Date: 08/30/2024  END OF SESSION:  PT End of Session - 08/30/24 1445     Visit Number 4    Number of Visits 16    Date for Recertification  10/07/24    Authorization Type bcbs    PT Start Time 1445    PT Stop Time 1525    PT Time Calculation (min) 40 min    Activity Tolerance Patient tolerated treatment well            Past Medical History:  Diagnosis Date   Allergy As a child   Allergic to nickel   Anxiety    Arthritis 2012   Asthma    Colon polyps    Constipation    Depression    Hypothyroid    Joint pain    Low thyroid  stimulating hormone (TSH) level    Past Surgical History:  Procedure Laterality Date   ABDOMINAL HYSTERECTOMY     APPENDECTOMY  2014   Removed when i had hysterectomy   BREAST SURGERY Left    lumpectomy   Patient Active Problem List   Diagnosis Date Noted   Cervical radiculitis 08/10/2024   Epidermal inclusion cyst 07/30/2024   Hidradenitis suppurativa 04/22/2024   Left wrist pain 12/08/2023   Insomnia 08/14/2023   Grief 02/12/2023   Neck muscle spasm 08/13/2022   Abnormal weight gain 09/13/2021   Class 3 severe obesity due to excess calories without serious comorbidity with body mass index (BMI) of 40.0 to 44.9 in adult [facility]) 09/13/2021   Anxiety 07/10/2021   Family history of colon cancer in father 02/10/2020   H/O: hysterectomy 02/10/2020   Former smoker 02/10/2020   Lumbar spondylosis 02/10/2020   Disc disease, degenerative, cervical 02/10/2020   Mild intermittent asthma without complication 02/10/2020   Mild obstructive sleep apnea 10/28/2018   Subcutaneous nodule of breast 06/20/2016   Hypothyroidism 09/12/2011    PCP: Dr Velma Ku  REFERRING PROVIDER: Dr Velma Ku  REFERRING DIAG: Cervical radiculitis   THERAPY DIAG:  Radiculopathy, cervical region  Abnormal  posture  Rationale for Evaluation and Treatment: Rehabilitation  ONSET DATE: 07/29/24  SUBJECTIVE:                                                                                                                                                                                                         SUBJECTIVE STATEMENT: Patient reports the palmar aspect of Rt hand is numb and tingling. Overall feeling  better. Will occasionally get a twinge in the Rt shoulder blade.   EVAL: Patient reports onset of R neck and shoulder pain over the past 1-2 weeks with symptoms radiating into the R UE with numbness and tingling as well as some weakness at times. Patient does not known of any accident or injury but does remember that she had congestion and deep coughing due to bronchitis. She has started on prednisone  with some improvement in muscle spasms which were occurring between shoulder blades. She continues to have some numbness and tingling in the R hand.     Hand dominance: Right  PERTINENT HISTORY:  Anxiety; depression; arthritis; joint pain; hysterectomy; asthma; appendectomy; lumpectomy Patient had neck problems including DDD which was treated ~ 6 years ago.  Cervical dysfunction problems started in 2012 with HA and neck pain. She has been treated with PT a few times over the years  She has a history of LBP as well.   PAIN:  Are you having pain? Yes: NPRS scale: none currently at worst 3/10 Pain location: R neck to R shoulder area  Pain description: burning with extension; aching  Aggravating factors: using R arm  Relieving factors: not using R arm; medication; lying on R side   PRECAUTIONS: None  RED FLAGS: None     WEIGHT BEARING RESTRICTIONS: No  FALLS:  Has patient fallen in last 6 months? 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  DIAGNOSTIC FINDINGS:  Xray cervical spine 08/10/24 - no results available   PATIENT SURVEYS:  NDI 30/50; 60%   SENSATION: Numbness and tingling R shoulder; ring around biceps area of arm; extensor forearm; whole hand   POSTURE: rounded shoulders, forward head, increased thoracic kyphosis, and flexed trunk     PALPATION: Tightness ant/lat/posterior cervical musculature; upper trap; leveator; pecs;  Decreased mobility thoracic spine   CERVICAL ROM:   Active ROM A/PROM (deg) eval 08/30/24 AROM  Flexion 35 pull R upper trap   Extension 29 pull R upper trap   Right lateral flexion 27 pain R upper trap   Left lateral flexion 24 pain R upper trap    Right rotation 33 pain R upper trap  45 pain Rt upper trap  Left rotation 48  56   (Blank rows = not tested)  UPPER EXTREMITY ROM: WFL's - tight end ranges elevation   Active ROM Right eval Left eval  Shoulder flexion    Shoulder extension    Shoulder abduction    Shoulder adduction    Shoulder extension    Shoulder internal rotation    Shoulder external rotation    Elbow flexion    Elbow extension    Wrist flexion    Wrist extension    Wrist ulnar deviation    Wrist radial deviation    Wrist pronation    Wrist supination     (Blank rows = not tested)  UPPER EXTREMITY MMT:  MMT Right eval Left eval  Shoulder flexion 5 5  Shoulder extension 5 5  Shoulder abduction 5 5  Shoulder adduction    Shoulder extension    Shoulder internal rotation    Shoulder external rotation    Middle trapezius 4 4  Lower trapezius 4 4   Elbow flexion 5 5  Elbow extension    Wrist flexion    Wrist extension 5 5  Wrist ulnar deviation    Wrist radial deviation    Wrist pronation    Wrist supination    Grip strength WFL's  WFL's   (Blank rows = not tested)  CERVICAL SPECIAL TESTS:  Upper limb tension test (ULTT): Positive, Spurling's test: Negative, and Distraction test: Positive   [facility] Adult PT Treatment:                                                DATE: 08/30/24 Therapeutic Exercise: Median nerve glide x 5 Seated thoracic extension x 10  HEP update Manual Therapy: STM Rt upper trap, levator scapulae, posterior rotator cuff, middle trap, rhomboids Suboccipital release CPAs and UPAs grade II-III mid/upper T-spine  Median and ulnar glide (+ median)  Neuromuscular re-ed: Seated W yellow band 2 x 10  Seated resisted horizontal shoulder abduction yellow band 2 x 10    [facility] Adult PT Treatment:                                                DATE: 08/25/24 Therapeutic Exercise: Pec doorway stretch 3 positions 2 x 30 sec   Manual Therapy: Thoracic extension seated with coregeous ball at T-spine - PT assisted extension  STM/TPR Rt > Lt anterior/lateral/ posterior cervical musculature; pecs; upper trap patient supine  Suboccipital release manually Grade II/III mob 1st rib  Use of golf balls for suboccipital release  Manual cervical traction Neural mobilization R UE 1 min x 2 good improvement in R UE radicular symptoms  Neuromuscular re-ed: Standing scap squeeze with noodle 5 sec x 10 some tingling in R UE initial - improved with reps  Seated cervical retraction to patient tolerance avoiding numbness/tingling in RUE  Supine cervical retraction to patient tolerance avoiding numbness/tingling in RUE  Seated scapular retraction x 10 with coregeous ball  Self care:   Use of golf ball for suboccipital release  Avoid watching TV in bed  Use of pillows for L sidelying in sleep   Work on posture and alignment,  evaluating positions in sitting and lying down     [facility] Adult PT Treatment:                                                DATE: 08/23/24 Therapeutic Exercise: Pec doorway stretch 2 x 30 sec  HEP review and update Manual Therapy: STM Rt upper trap, levator scapulae, posterior rotator cuff, middle trap, rhomboids Suboccipital release Demo and returned demo of use of tennis ball for self-soft tissue mobilization Manual cervical traction- demo and returned demo of self-traction  Neuromuscular re-ed: Seated and supine cervical retraction d/c due to numbness/tingling in RUE  Seated scapular retraction x 10  Supine  horizontal shoulder abduction 2 x 10 yellow band     PATIENT EDUCATION:  Education details: HEP update  Person educated: Patient Education method: Explanation, Demonstration, Tactile cues, Verbal cues, and Handouts Education comprehension: verbalized understanding, returned demonstration, verbal cues required, tactile cues required, and needs further education  HOME EXERCISE PROGRAM: Access Code: [email or web address] URL: [email or web address] Date: 08/30/2024 Prepared by: Lucie Meeter  Exercises - Seated Scapular Retraction  - 2 x daily - 7 x weekly - 1-2 sets - 10 reps - 10 sec  hold - Doorway Pec Stretch at 60 Degrees Abduction  - 3 x daily - 7 x weekly - 1 sets - 3 reps - Doorway Pec Stretch at 90 Degrees Abduction  - 3 x daily - 7 x weekly - 1 sets - 3 reps - 30 seconds  hold - Doorway Pec Stretch at 120 Degrees Abduction  - 3 x daily - 7 x weekly - 1 sets - 3 reps - 30 second hold  hold - Standing Infraspinatus/Teres Minor Release with Ball at Wall  - 2 x daily - 7 x weekly - Standing Pectoral Release with Ball at [employer]  - 3-4 x daily - 7 x weekly - Supine Suboccipital Release with Tennis Balls  - 1 x daily - 7 x weekly - 2 min hold - Hooklying Neck Distraction and Traction  - 1 x daily - 7 x weekly - 1 sets - 10 reps - 5 sec  hold - Seated Thoracic Extension AROM   - 3-4 x daily - 7 x weekly - 1 sets - 3-5 reps - 5-10  sec  hold - Standing Median Nerve Glide  - 1 x daily - 7 x weekly - 1 sets - 10 reps - Shoulder W - External Rotation with Resistance  - 1 x daily - 7 x weekly - 2 sets - 10 reps - Standing Shoulder Horizontal Abduction with Resistance  - 1 x daily - 7 x weekly - 2 sets - 10 reps  ASSESSMENT:  CLINICAL IMPRESSION: Patient with initial radicular symptoms into Rt hand with PAIVM at T5-6. With thoracic mobilizations able to clear this numbness/tingling. Patient noted to have positive median nerve glide, so this was added to HEP. Progressed postural correctives with patient demonstrating good form without onset of pain or paraesthesia. Cervical rotation AROM has improved bilaterally, though still has pain at available end range of Rt rotation.   EVAL: Patient is a 53 y.o. female who was seen today for physical therapy evaluation and treatment for cervical radiculitis. She reports onset of symptoms ~ 2 weeks ago with no known injury but she does relate that she has been deep coughing for the past month due to bronchitis. She has also been sleeping in her recliner due to bronchitis. Patient has poor posture and alignment; limited cervical ROM, mobility; pain and radicular symptoms with cervical motions; muscular tightness cervical and thoracic musculature; limited positional tolerance; intermittent numbness, tingling R UE shoulder to hand with hand in glove like pattern. Patient has a history of cervical radiculopathy over the past 12 + years with episodes of flare up treated with physical therapy including dry needling. She also has a history of lumbar dysfunction including LBP. Patient reports sedentary lifestyle. She will benefit from PT to address problems identified.   OBJECTIVE IMPAIRMENTS: decreased activity tolerance, decreased mobility, decreased ROM, increased fascial restrictions, increased muscle spasms, impaired UE functional use, improper  body mechanics, postural dysfunction, obesity, and pain.    GOALS: Goals reviewed  with patient? Yes  SHORT TERM GOALS: Target date: 09/09/2024   Independent in initial HEP  Baseline:  Goal status: INITIAL  2.  Decrease R UE radicular symptoms by 50-70%  Baseline:  Goal status: INITIAL  3.  Improve posture and alignment with patient to demonstrate activation of posterior shoudler girdle musculature and improved cervical alignment  Baseline:  Goal status: INITIAL   LONG TERM GOALS: Target date: 10/07/2024   Decrease pain in cervical and shoulder girdle area as well as R UE by 75% allowing patient to return to normal functional activities  Baseline:  Goal status: INITIAL  2.  Increase cervical ROM by 5-8 deg in lateral flexion and rotation allowing patient to check traffic more easily when driving  Baseline:  Goal status: INITIAL  3.  Patient reports ability to sleep 4-6 hours in her bed instead of recliner  Baseline:  Goal status: INITIAL  4.  Patient reports and demonstrates proper postures and positions for ADL's  Baseline:  Goal status: INITIAL  5.  Improve NDI by 10% indicating improve functional abilities  Baseline: NDI 30/50; 60% Goal status: INITIAL  6.  Independent in HEP  Baseline:  Goal status: INITIAL   PLAN:  PT FREQUENCY: 2x/week  PT DURATION: 8 weeks  PLANNED INTERVENTIONS: 97164- PT Re-evaluation, 97110-Therapeutic exercises, 97530- Therapeutic activity, 97112- Neuromuscular re-education, 97535- Self Care, [id number]- Manual therapy, Patient/Family education, Taping, and Joint mobilization  PLAN FOR NEXT SESSION: review and progress exercises; continue with spine care and ergonomic education and correction as indicated; manual work and modalities as indicated   Lucie Meeter, PT, DPT, ATC 08/30/2024 3:26 PM     "

## 2024-09-01 ENCOUNTER — Ambulatory Visit: Admitting: Rehabilitative and Restorative Service Providers"

## 2024-09-03 ENCOUNTER — Ambulatory Visit

## 2024-09-03 DIAGNOSIS — R29898 Other symptoms and signs involving the musculoskeletal system: Secondary | ICD-10-CM

## 2024-09-03 DIAGNOSIS — R293 Abnormal posture: Secondary | ICD-10-CM

## 2024-09-03 DIAGNOSIS — M5412 Radiculopathy, cervical region: Secondary | ICD-10-CM | POA: Diagnosis not present

## 2024-09-03 NOTE — Therapy (Signed)
 " OUTPATIENT PHYSICAL THERAPY CERVICAL TREATMENT   Patient Name: Alexandra Greer MRN: [id number] DOB:May 13, 1971, 53 y.o., female Today's Date: 09/03/2024  END OF SESSION:  PT End of Session - 09/03/24 0916     Visit Number 5    Number of Visits 16    Date for Recertification  10/07/24    Authorization Type bcbs    Authorization Time Period 45 VISITS PER YEAR    PT Start Time [phone]    PT Stop Time 1000    PT Time Calculation (min) 42 min    Activity Tolerance Patient tolerated treatment well    Behavior During Therapy [facility] for tasks assessed/performed         Past Medical History:  Diagnosis Date   Allergy As a child   Allergic to nickel   Anxiety    Arthritis 2012   Asthma    Colon polyps    Constipation    Depression    Hypothyroid    Joint pain    Low thyroid  stimulating hormone (TSH) level    Past Surgical History:  Procedure Laterality Date   ABDOMINAL HYSTERECTOMY     APPENDECTOMY  2014   Removed when i had hysterectomy   BREAST SURGERY Left    lumpectomy   Patient Active Problem List   Diagnosis Date Noted   Cervical radiculitis 08/10/2024   Epidermal inclusion cyst 07/30/2024   Hidradenitis suppurativa 04/22/2024   Left wrist pain 12/08/2023   Insomnia 08/14/2023   Grief 02/12/2023   Neck muscle spasm 08/13/2022   Abnormal weight gain 09/13/2021   Class 3 severe obesity due to excess calories without serious comorbidity with body mass index (BMI) of 40.0 to 44.9 in adult [facility]) 09/13/2021   Anxiety 07/10/2021   Family history of colon cancer in father 02/10/2020   H/O: hysterectomy 02/10/2020   Former smoker 02/10/2020   Lumbar spondylosis 02/10/2020   Disc disease, degenerative, cervical 02/10/2020   Mild intermittent asthma without complication 02/10/2020   Mild obstructive sleep apnea 10/28/2018   Subcutaneous nodule of breast 06/20/2016   Hypothyroidism 09/12/2011    PCP: Dr Velma Ku  REFERRING PROVIDER: Dr Velma Ku  REFERRING  DIAG: Cervical radiculitis   THERAPY DIAG:  Radiculopathy, cervical region  Abnormal posture  Other symptoms and signs involving the musculoskeletal system  Rationale for Evaluation and Treatment: Rehabilitation  ONSET DATE: 07/29/24  SUBJECTIVE:  SUBJECTIVE STATEMENT: Patient reports she is overall feeling better and sleeping better; states she has not had any radiating pain from neck to shoulder, continues to have occasional tingling/numbness in Rt hand. States the pain at shoulder blade is also better; states she can get a twinge when rotating to the Rt while lifting arm. Patient states the only pain she has today is toothache on Lt side.    EVAL: Patient reports onset of R neck and shoulder pain over the past 1-2 weeks with symptoms radiating into the R UE with numbness and tingling as well as some weakness at times. Patient does not known of any accident or injury but does remember that she had congestion and deep coughing due to bronchitis. She has started on prednisone  with some improvement in muscle spasms which were occurring between shoulder blades. She continues to have some numbness and tingling in the R hand.     Hand dominance: Right  PERTINENT HISTORY:  Anxiety; depression; arthritis; joint pain; hysterectomy; asthma; appendectomy; lumpectomy Patient had neck problems including DDD which was treated ~ 6 years ago.  Cervical dysfunction problems started in 2012 with HA and neck pain. She has been treated with PT a few times over the years  She has a history of LBP as well.   PAIN:  Are you having pain? Yes: NPRS scale: 0/10 currently Pain location: R neck to R shoulder area  Pain description: burning with extension; aching  Aggravating factors: using R arm  Relieving  factors: not using R arm; medication; lying on R side   PRECAUTIONS: None  RED FLAGS: None     WEIGHT BEARING RESTRICTIONS: No  FALLS:  Has patient fallen in last 6 months? 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  DIAGNOSTIC FINDINGS:  Xray cervical spine 08/10/24 - no results available   PATIENT SURVEYS:  NDI 30/50; 60%   SENSATION: Numbness and tingling R shoulder; ring around biceps area of arm; extensor forearm; whole hand   POSTURE: rounded shoulders, forward head, increased thoracic kyphosis, and flexed trunk     PALPATION: Tightness ant/lat/posterior cervical musculature; upper trap; leveator; pecs;  Decreased mobility thoracic spine   CERVICAL ROM:   Active ROM A/PROM (deg) eval 08/30/24 AROM  Flexion 35 pull R upper trap   Extension 29 pull R upper trap   Right lateral flexion 27 pain R upper trap   Left lateral flexion 24 pain R upper trap    Right rotation 33 pain R upper trap  45 pain Rt upper trap  Left rotation 48  56   (Blank rows = not tested)  UPPER EXTREMITY ROM: WFL's - tight end ranges elevation   Active ROM Right eval Left eval  Shoulder flexion    Shoulder extension    Shoulder abduction    Shoulder adduction    Shoulder extension    Shoulder internal rotation    Shoulder external rotation    Elbow flexion  Elbow extension    Wrist flexion    Wrist extension    Wrist ulnar deviation     Wrist radial deviation    Wrist pronation    Wrist supination     (Blank rows = not tested)  UPPER EXTREMITY MMT:  MMT Right eval Left eval  Shoulder flexion 5 5  Shoulder extension 5 5  Shoulder abduction 5 5  Shoulder adduction    Shoulder extension    Shoulder internal rotation    Shoulder external rotation    Middle trapezius 4 4  Lower trapezius 4 4  Elbow flexion 5 5  Elbow extension    Wrist flexion    Wrist extension 5 5  Wrist ulnar deviation    Wrist radial deviation    Wrist pronation    Wrist supination    Grip strength WFL's  WFL's   (Blank rows = not tested)  CERVICAL SPECIAL TESTS:  Upper limb tension test (ULTT): Positive, Spurling's test: Negative, and Distraction test: Positive   [facility] Adult PT Treatment:                                                DATE: 09/03/2024 Manual Therapy: STM cervical paraspinals, suboccipitals TPR suboccipital release Manual cervical traction Passive UT/LS stretch (Lt>Rt) Neuromuscular re-ed: Supine cervical retraction with towel roll behind head 10 x 3-5 sec Standing with back against noodle: Resisted W + red TB with scp retraction 2 x 10 Shoulder horizontal abd + red TB --> x10 palms down, x10 palms up Shoulder ER + yellow TB Therapeutic Activity: Seated median nerve glide x 10 Doorway pec stretches + postural cues for proprioceptive awareness --> 60 deg, 90 deg, 120 deg x 30 sec each Radial nerve glide --> didn't feel much Ulnar nerve glide (okay fingers) Wrist flexion stretch on edge of table Supine thoracic extension over 1/2 foam roller   [facility] Adult PT Treatment:                                                DATE: 08/30/24 Therapeutic Exercise: Median nerve glide x 5 Seated thoracic extension x 10  HEP update Manual Therapy: STM Rt upper trap, levator scapulae, posterior rotator cuff, middle trap, rhomboids Suboccipital release CPAs and UPAs grade II-III mid/upper T-spine  Median and ulnar glide  (+ median)  Neuromuscular re-ed: Seated W yellow band 2 x 10  Seated resisted horizontal shoulder abduction yellow band 2 x 10    [facility] Adult PT Treatment:                                                DATE: 08/25/24 Therapeutic Exercise: Pec doorway stretch 3 positions 2 x 30 sec   Manual Therapy: Thoracic extension seated with coregeous ball at T-spine - PT assisted extension  STM/TPR Rt > Lt anterior/lateral/ posterior cervical musculature; pecs; upper trap patient supine  Suboccipital release manually Grade II/III mob 1st rib  Use of golf balls for suboccipital release  Manual cervical traction Neural mobilization R UE 1 min x 2 good improvement in R UE  radicular symptoms  Neuromuscular re-ed: Standing scap squeeze with noodle 5 sec x 10 some tingling in R UE initial - improved with reps  Seated cervical retraction to patient tolerance avoiding numbness/tingling in RUE  Supine cervical retraction to patient tolerance avoiding numbness/tingling in RUE  Seated scapular retraction x 10 with coregeous ball  Self care:   Use of golf ball for suboccipital release  Avoid watching TV in bed  Use of pillows for L sidelying in sleep   Work on posture and alignment, evaluating positions in sitting and lying down     PATIENT EDUCATION:  Education details: HEP update  Person educated: Patient Education method: Programmer, Multimedia, Facilities Manager, Actor cues, Verbal cues, and Handouts Education comprehension: verbalized understanding, returned demonstration, verbal cues required, tactile cues required, and needs further education  HOME EXERCISE PROGRAM: Access Code: [email or web address] URL: [email or web address] Date: 09/03/2024 Prepared by: Lamarr Price  Exercises - Seated Scapular Retraction  - 2 x daily - 7 x weekly - 1-2 sets - 10 reps - 10 sec  hold - Doorway Pec Stretch at 60 Degrees Abduction  - 3 x daily - 7 x weekly - 1 sets - 3 reps - Doorway Pec Stretch at 90 Degrees  Abduction  - 3 x daily - 7 x weekly - 1 sets - 3 reps - 30 seconds  hold - Doorway Pec Stretch at 120 Degrees Abduction  - 3 x daily - 7 x weekly - 1 sets - 3 reps - 30 second hold  hold - Standing Infraspinatus/Teres Minor Release with Ball at Wall  - 2 x daily - 7 x weekly - Standing Pectoral Release with Ball at [employer]  - 3-4 x daily - 7 x weekly - Supine Suboccipital Release with Tennis Balls  - 1 x daily - 7 x weekly - 2 min hold - Hooklying Neck Distraction and Traction  - 1 x daily - 7 x weekly - 1 sets - 10 reps - 5 sec  hold - Seated Thoracic Extension AROM  - 3-4 x daily - 7 x weekly - 1 sets - 3-5 reps - 5-10  sec  hold - Standing Median Nerve Glide  - 1 x daily - 7 x weekly - 1 sets - 10 reps - Shoulder W - External Rotation with Resistance  - 1 x daily - 7 x weekly - 2 sets - 10 reps - Standing Shoulder Horizontal Abduction with Resistance  - 1 x daily - 7 x weekly - 2 sets - 10 reps - Supine Thoracic Mobilization Towel Roll Horizontal  - 2 x daily - 7 x weekly - 1 sets - 1 reps - Supine Thoracic Mobilization Towel Roll Vertical with Arm Stretch  - 2 x daily - 7 x weekly - 1 sets - 5-10 reps  ASSESSMENT:  CLINICAL IMPRESSION: Patient is making good progress with POC and HEP, citing decreased radicular symptoms and cervical pain. Cueing provided to improve scapula retraction mechanics and decrease upper trapezius compensation during standing postural strengthening exercises. Patient felt greatest tension/stretch with ulnar and median nerve glides but not so much with radial. Better response with modification of thoracic extension exercise in supine with 1/2 foam roller compared to in seated position.   EVAL: Patient is a 53 y.o. female who was seen today for physical therapy evaluation and treatment for cervical radiculitis. She reports onset of symptoms ~ 2 weeks ago with no known injury but she does relate that she has been deep coughing for the past month  due to bronchitis. She has  also been sleeping in her recliner due to bronchitis. Patient has poor posture and alignment; limited cervical ROM, mobility; pain and radicular symptoms with cervical motions; muscular tightness cervical and thoracic musculature; limited positional tolerance; intermittent numbness, tingling R UE shoulder to hand with hand in glove like pattern. Patient has a history of cervical radiculopathy over the past 12 + years with episodes of flare up treated with physical therapy including dry needling. She also has a history of lumbar dysfunction including LBP. Patient reports sedentary lifestyle. She will benefit from PT to address problems identified.   OBJECTIVE IMPAIRMENTS: decreased activity tolerance, decreased mobility, decreased ROM, increased fascial restrictions, increased muscle spasms, impaired UE functional use, improper body mechanics, postural dysfunction, obesity, and pain.    GOALS: Goals reviewed with patient? Yes  SHORT TERM GOALS: Target date: 09/09/2024  Independent in initial HEP  Baseline:  Goal status: INITIAL  2.  Decrease R UE radicular symptoms by 50-70%  Baseline:  Goal status: INITIAL  3.  Improve posture and alignment with patient to demonstrate activation of posterior shoudler girdle musculature and improved cervical alignment  Baseline:  Goal status: INITIAL   LONG TERM GOALS: Target date: 10/07/2024  Decrease pain in cervical and shoulder girdle area as well as R UE by 75% allowing patient to return to normal functional activities  Baseline:  Goal status: INITIAL  2.  Increase cervical ROM by 5-8 deg in lateral flexion and rotation allowing patient to check traffic more easily when driving  Baseline:  Goal status: INITIAL  3.  Patient reports ability to sleep 4-6 hours in her bed instead of recliner  Baseline:  Goal status: INITIAL  4.  Patient reports and demonstrates proper postures and positions for ADL's  Baseline:  Goal status: INITIAL  5.  Improve  NDI by 10% indicating improve functional abilities  Baseline: NDI 30/50; 60% Goal status: INITIAL  6.  Independent in HEP  Baseline:  Goal status: INITIAL   PLAN:  PT FREQUENCY: 2x/week  PT DURATION: 8 weeks  PLANNED INTERVENTIONS: 97164- PT Re-evaluation, 97110-Therapeutic exercises, 97530- Therapeutic activity, 97112- Neuromuscular re-education, 97535- Self Care, [id number]- Manual therapy, Patient/Family education, Taping, and Joint mobilization  PLAN FOR NEXT SESSION: review and progress exercises; continue with spine care and ergonomic education and correction as indicated; manual work and modalities as indicated   Lamarr Price, PTA 09/03/2024 10:01 AM     "

## 2024-09-07 ENCOUNTER — Ambulatory Visit

## 2024-09-07 DIAGNOSIS — R29898 Other symptoms and signs involving the musculoskeletal system: Secondary | ICD-10-CM

## 2024-09-07 DIAGNOSIS — M5412 Radiculopathy, cervical region: Secondary | ICD-10-CM | POA: Diagnosis not present

## 2024-09-07 DIAGNOSIS — R293 Abnormal posture: Secondary | ICD-10-CM | POA: Diagnosis not present

## 2024-09-07 NOTE — Therapy (Signed)
 " OUTPATIENT PHYSICAL THERAPY CERVICAL TREATMENT   Patient Name: Alexandra Greer MRN: [id number] DOB:21-Apr-1971, 53 y.o., female Today's Date: 09/07/2024  END OF SESSION:  PT End of Session - 09/07/24 1530     Visit Number 6    Number of Visits 16    Date for Recertification  10/07/24    Authorization Type bcbs    Authorization Time Period 45 VISITS PER YEAR    PT Start Time 1530    PT Stop Time 1610    PT Time Calculation (min) 40 min    Activity Tolerance Patient tolerated treatment well    Behavior During Therapy WFL for tasks assessed/performed          Past Medical History:  Diagnosis Date   Allergy As a child   Allergic to nickel   Anxiety    Arthritis 2012   Asthma    Colon polyps    Constipation    Depression    Hypothyroid    Joint pain    Low thyroid  stimulating hormone (TSH) level    Past Surgical History:  Procedure Laterality Date   ABDOMINAL HYSTERECTOMY     APPENDECTOMY  2014   Removed when i had hysterectomy   BREAST SURGERY Left    lumpectomy   Patient Active Problem List   Diagnosis Date Noted   Cervical radiculitis 08/10/2024   Epidermal inclusion cyst 07/30/2024   Hidradenitis suppurativa 04/22/2024   Left wrist pain 12/08/2023   Insomnia 08/14/2023   Grief 02/12/2023   Neck muscle spasm 08/13/2022   Abnormal weight gain 09/13/2021   Class 3 severe obesity due to excess calories without serious comorbidity with body mass index (BMI) of 40.0 to 44.9 in adult [facility]) 09/13/2021   Anxiety 07/10/2021   Family history of colon cancer in father 02/10/2020   H/O: hysterectomy 02/10/2020   Former smoker 02/10/2020   Lumbar spondylosis 02/10/2020   Disc disease, degenerative, cervical 02/10/2020   Mild intermittent asthma without complication 02/10/2020   Mild obstructive sleep apnea 10/28/2018   Subcutaneous nodule of breast 06/20/2016   Hypothyroidism 09/12/2011    PCP: Dr Velma Ku  REFERRING PROVIDER: Dr Velma Ku  REFERRING DIAG: Cervical radiculitis   THERAPY DIAG:  Radiculopathy, cervical region  Abnormal posture  Other symptoms and signs involving the musculoskeletal system  Rationale for Evaluation and Treatment: Rehabilitation  ONSET DATE: 07/29/24  SUBJECTIVE:  SUBJECTIVE STATEMENT: Patient reports she is feeling a lot better. Not having as frequent numbness/tingling in the RUE. The thumb is a little tingly right now. No reports of pain currently. Had f/u with dentist for tooth pain. Was instructed to start wearing a night guard due to concerns of teeth grinding.   EVAL: Patient reports onset of R neck and shoulder pain over the past 1-2 weeks with symptoms radiating into the R UE with numbness and tingling as well as some weakness at times. Patient does not known of any accident or injury but does remember that she had congestion and deep coughing due to bronchitis. She has started on prednisone  with some improvement in muscle spasms which were occurring between shoulder blades. She continues to have some numbness and tingling in the R hand.     Hand dominance: Right  PERTINENT HISTORY:  Anxiety; depression; arthritis; joint pain; hysterectomy; asthma; appendectomy; lumpectomy Patient had neck problems including DDD which was treated ~ 6 years ago.  Cervical dysfunction problems started in 2012 with HA and neck pain. She has been treated with PT a few times over the years  She has a history of LBP as well.   PAIN:  Are you having pain? Yes: NPRS scale: 2/10 Pain location: Rt thumb Pain description: tingling  Aggravating factors: laying on Rt side, driving  Relieving factors: not using R arm; medication  PRECAUTIONS: None  RED FLAGS: None     WEIGHT BEARING RESTRICTIONS:  No  FALLS:  Has patient fallen in last 6 months? 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  DIAGNOSTIC FINDINGS:  Xray cervical spine 08/10/24 - no results available   PATIENT SURVEYS:  NDI 30/50; 60%   SENSATION: Numbness and tingling R shoulder; ring around biceps area of arm; extensor forearm; whole hand   POSTURE: rounded shoulders, forward head, increased thoracic kyphosis, and flexed trunk     PALPATION: Tightness ant/lat/posterior cervical musculature; upper trap; leveator; pecs;  Decreased mobility thoracic spine   CERVICAL ROM:   Active ROM A/PROM (deg) eval 08/30/24 AROM 09/07/24 AROM  Flexion 35 pull R upper trap  50  Extension 29 pull R upper trap  50; neck pain and tingling in Rt hand  Right lateral flexion 27 pain R upper trap  28  Left lateral flexion 24 pain R upper trap   25  Right rotation 33 pain R upper trap  45 pain Rt upper trap 60  Left rotation 48  56 62   (Blank rows = not tested)  UPPER EXTREMITY ROM: WFL's - tight end ranges elevation   Active ROM Right eval Left eval  Shoulder flexion    Shoulder extension    Shoulder abduction    Shoulder adduction    Shoulder extension    Shoulder internal rotation    Shoulder external rotation    Elbow flexion    Elbow extension    Wrist flexion    Wrist extension  Wrist ulnar deviation    Wrist radial deviation    Wrist pronation    Wrist supination      (Blank rows = not tested)  UPPER EXTREMITY MMT:  MMT Right eval Left eval  Shoulder flexion 5 5  Shoulder extension 5 5  Shoulder abduction 5 5  Shoulder adduction    Shoulder extension    Shoulder internal rotation    Shoulder external rotation    Middle trapezius 4 4  Lower trapezius 4 4  Elbow flexion 5 5  Elbow extension    Wrist flexion    Wrist extension 5 5  Wrist ulnar deviation    Wrist radial deviation    Wrist pronation    Wrist supination    Grip strength WFL's  WFL's   (Blank rows = not tested)  CERVICAL SPECIAL TESTS:  Upper limb tension test (ULTT): Positive, Spurling's test: Negative, and Distraction test: Positive  [facility] Adult PT Treatment:                                                DATE: 09/07/24 Therapeutic Exercise: Cervical ROM measurements  Neuromuscular re-ed: Resisted shoulder extension 2 x 15 red band  Standing rows 2 x 15 green band  Resisted shoulder adduction red band 2 x 10  Prone scapular retraction 2 x 10  Prone T 2 x 10  Seated resisted W 2 x 10  Seated resisted horizontal shoulder abduction red band 2 x 10   Self Care: Sleep positioning recommendations   [facility] Adult PT Treatment:                                                DATE: 09/03/2024 Manual Therapy: STM cervical paraspinals, suboccipitals TPR suboccipital release Manual cervical traction Passive UT/LS stretch (Lt>Rt) Neuromuscular re-ed: Supine cervical retraction with towel roll behind head 10 x 3-5 sec Standing with back against noodle: Resisted W + red TB with scp retraction 2 x 10 Shoulder horizontal abd + red TB --> x10 palms down, x10 palms up Shoulder ER + yellow TB Therapeutic Activity: Seated median nerve glide x 10 Doorway pec stretches + postural cues for proprioceptive awareness --> 60 deg, 90 deg, 120 deg x 30 sec each Radial nerve glide --> didn't feel much Ulnar nerve glide (okay fingers) Wrist flexion stretch on edge of table Supine  thoracic extension over 1/2 foam roller   [facility] Adult PT Treatment:                                                DATE: 08/30/24 Therapeutic Exercise: Median nerve glide x 5 Seated thoracic extension x 10  HEP update Manual Therapy: STM Rt upper trap, levator scapulae, posterior rotator cuff, middle trap, rhomboids Suboccipital release CPAs and UPAs grade II-III mid/upper T-spine  Median and ulnar glide (+ median)  Neuromuscular re-ed: Seated W yellow band 2 x 10  Seated resisted horizontal shoulder abduction yellow band 2 x 10     PATIENT EDUCATION:  Education details: HEP update  Person educated: Patient Education method: Explanation, Demonstration, Tactile cues,  Verbal cues, and Handouts Education comprehension: verbalized understanding, returned demonstration, verbal cues required, tactile cues required, and needs further education  HOME EXERCISE PROGRAM: Access Code: [email or web address] URL: [email or web address] Date: 09/07/2024 Prepared by: Lucie Meeter  Exercises - Seated Scapular Retraction  - 2 x daily - 7 x weekly - 1-2 sets - 10 reps - 10 sec  hold - Doorway Pec Stretch at 60 Degrees Abduction  - 3 x daily - 7 x weekly - 1 sets - 3 reps - Doorway Pec Stretch at 90 Degrees Abduction  - 3 x daily - 7 x weekly - 1 sets - 3 reps - 30 seconds  hold - Doorway Pec Stretch at 120 Degrees Abduction  - 3 x daily - 7 x weekly - 1 sets - 3 reps - 30 second hold  hold - Standing Infraspinatus/Teres Minor Release with Ball at Wall  - 2 x daily - 7 x weekly - Standing Pectoral Release with Ball at [employer]  - 3-4 x daily - 7 x weekly - Supine Suboccipital Release with Tennis Balls  - 1 x daily - 7 x weekly - 2 min hold - Hooklying Neck Distraction and Traction  - 1 x daily - 7 x weekly - 1 sets - 10 reps - 5 sec  hold - Seated Thoracic Extension AROM  - 3-4 x daily - 7 x weekly - 1 sets - 3-5 reps - 5-10  sec  hold - Standing Median Nerve Glide  - 1 x daily - 7 x weekly - 1 sets  - 10 reps - Shoulder W - External Rotation with Resistance  - 1 x daily - 7 x weekly - 2 sets - 10 reps - Standing Shoulder Horizontal Abduction with Resistance  - 1 x daily - 7 x weekly - 2 sets - 10 reps - Supine Thoracic Mobilization Towel Roll Horizontal  - 2 x daily - 7 x weekly - 1 sets - 1 reps - Supine Thoracic Mobilization Towel Roll Vertical with Arm Stretch  - 2 x daily - 7 x weekly - 1 sets - 5-10 reps - Standing Shoulder Row with Anchored Resistance  - 1 x daily - 7 x weekly - 2 sets - 10 reps  ASSESSMENT:  CLINICAL IMPRESSION: Patient arrives without reports of pain reporting continued improvement in her symptoms. Cervical AROM has improved in all planes only noting pain and paraesthesia at end range of cervical extension. Focused on progression of postural strengthening without onset of pain, though does require frequent cues for proper shoulder/cervical alignment.   EVAL: Patient is a 53 y.o. female who was seen today for physical therapy evaluation and treatment for cervical radiculitis. She reports onset of symptoms ~ 2 weeks ago with no known injury but she does relate that she has been deep coughing for the past month due to bronchitis. She has also been sleeping in her recliner due to bronchitis. Patient has poor posture and alignment; limited cervical ROM, mobility; pain and radicular symptoms with cervical motions; muscular tightness cervical and thoracic musculature; limited positional tolerance; intermittent numbness, tingling R UE shoulder to hand with hand in glove like pattern. Patient has a history of cervical radiculopathy over the past 12 + years with episodes of flare up treated with physical therapy including dry needling. She also has a history of lumbar dysfunction including LBP. Patient reports sedentary lifestyle. She will benefit from PT to address problems identified.   OBJECTIVE IMPAIRMENTS: decreased activity tolerance, decreased mobility, decreased ROM,  increased fascial restrictions, increased muscle spasms, impaired UE functional use, improper body mechanics, postural dysfunction, obesity, and pain.    GOALS: Goals reviewed with patient? Yes  SHORT TERM GOALS: Target date: 09/09/2024  Independent in initial HEP  Baseline:  Goal status: MET  2.  Decrease R UE radicular symptoms by 50-70%  Baseline:  09/07/24: has not experienced shooting pain down the RUE recently.  Goal status: MET  3.  Improve posture and alignment with patient to demonstrate activation of posterior shoudler girdle musculature and improved cervical alignment  Baseline:  09/07/24: demo improved postural awareness  Goal status: MET   LONG TERM GOALS: Target date: 10/07/2024  Decrease pain in cervical and shoulder girdle area as well as R UE by 75% allowing patient to return to normal functional activities  Baseline:  Goal status: INITIAL  2.  Increase cervical ROM by 5-8 deg in lateral flexion and rotation allowing patient to check traffic more easily when driving  Baseline:  Goal status: partially met  3.  Patient reports ability to sleep 4-6 hours in her bed instead of recliner  Baseline:  Goal status: INITIAL  4.  Patient reports and demonstrates proper postures and positions for ADL's  Baseline:  Goal status: INITIAL  5.  Improve NDI by 10% indicating improve functional abilities  Baseline: NDI 30/50; 60% Goal status: INITIAL  6.  Independent in HEP  Baseline:  Goal status: INITIAL   PLAN:  PT FREQUENCY: 2x/week  PT DURATION: 8 weeks  PLANNED INTERVENTIONS: 97164- PT Re-evaluation, 97110-Therapeutic exercises, 97530- Therapeutic activity, 97112- Neuromuscular re-education, 97535- Self Care, [id number]- Manual therapy, Patient/Family education, Taping, and Joint mobilization  PLAN FOR NEXT SESSION: review and progress exercises; continue with spine care and ergonomic education and correction as indicated; manual work and modalities as indicated      Lucie Meeter, PT, DPT, ATC 09/07/2024 4:17 PM   "

## 2024-09-10 ENCOUNTER — Ambulatory Visit

## 2024-09-10 NOTE — Therapy (Incomplete)
 " OUTPATIENT PHYSICAL THERAPY CERVICAL TREATMENT   Patient Name: Alexandra Greer MRN: [id number] DOB:09-01-1971, 54 y.o., female Today's Date: 09/10/2024  END OF SESSION:    Past Medical History:  Diagnosis Date   Allergy As a child   Allergic to nickel   Anxiety    Arthritis 2012   Asthma    Colon polyps    Constipation    Depression    Hypothyroid    Joint pain    Low thyroid  stimulating hormone (TSH) level    Past Surgical History:  Procedure Laterality Date   ABDOMINAL HYSTERECTOMY     APPENDECTOMY  2014   Removed when i had hysterectomy   BREAST SURGERY Left    lumpectomy   Patient Active Problem List   Diagnosis Date Noted   Cervical radiculitis 08/10/2024   Epidermal inclusion cyst 07/30/2024   Hidradenitis suppurativa 04/22/2024   Left wrist pain 12/08/2023   Insomnia 08/14/2023   Grief 02/12/2023   Neck muscle spasm 08/13/2022   Abnormal weight gain 09/13/2021   Class 3 severe obesity due to excess calories without serious comorbidity with body mass index (BMI) of 40.0 to 44.9 in adult [facility]) 09/13/2021   Anxiety 07/10/2021   Family history of colon cancer in father 02/10/2020   H/O: hysterectomy 02/10/2020   Former smoker 02/10/2020   Lumbar spondylosis 02/10/2020   Disc disease, degenerative, cervical 02/10/2020   Mild intermittent asthma without complication 02/10/2020   Mild obstructive sleep apnea 10/28/2018   Subcutaneous nodule of breast 06/20/2016   Hypothyroidism 09/12/2011    PCP: Dr Velma Ku  REFERRING PROVIDER: Dr Velma Ku  REFERRING DIAG: Cervical radiculitis   THERAPY DIAG:  No diagnosis found.  Rationale for Evaluation and Treatment: Rehabilitation  ONSET DATE: 07/29/24  SUBJECTIVE:                                                                                                                                                                                                         SUBJECTIVE STATEMENT: Patient  reports she is feeling a lot better. Not having as frequent numbness/tingling in the RUE. The thumb is a little tingly right now. No reports of pain currently. Had f/u with dentist for tooth pain. Was instructed to start wearing a night guard due to concerns of teeth grinding.   EVAL: Patient reports onset of R neck and shoulder pain over the past 1-2 weeks with symptoms radiating into the R UE with numbness and tingling as well as some weakness at times. Patient does not known of any accident or  injury but does remember that she had congestion and deep coughing due to bronchitis. She has started on prednisone  with some improvement in muscle spasms which were occurring between shoulder blades. She continues to have some numbness and tingling in the R hand.     Hand dominance: Right  PERTINENT HISTORY:  Anxiety; depression; arthritis; joint pain; hysterectomy; asthma; appendectomy; lumpectomy Patient had neck problems including DDD which was treated ~ 6 years ago.  Cervical dysfunction problems started in 2012 with HA and neck pain. She has been treated with PT a few times over the years  She has a history of LBP as well.   PAIN:  Are you having pain? Yes: NPRS scale: 2/10 Pain location: Rt thumb Pain description: tingling  Aggravating factors: laying on Rt side, driving  Relieving factors: not using R arm; medication  PRECAUTIONS: None  RED FLAGS: None     WEIGHT BEARING RESTRICTIONS: No  FALLS:  Has patient fallen in last 6 months? 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  DIAGNOSTIC FINDINGS:  Xray cervical spine 08/10/24 - no results available   PATIENT SURVEYS:  NDI 30/50; 60%   SENSATION: Numbness and tingling R shoulder; ring around biceps area of arm; extensor forearm; whole hand   POSTURE: rounded shoulders, forward head, increased thoracic kyphosis, and flexed trunk     PALPATION: Tightness ant/lat/posterior cervical musculature; upper trap; leveator; pecs;  Decreased mobility thoracic spine   CERVICAL ROM:   Active ROM A/PROM (deg) eval 08/30/24 AROM 09/07/24 AROM  Flexion 35 pull R upper trap  50  Extension 29 pull R upper trap  50; neck pain and tingling in Rt hand  Right lateral flexion 27 pain R upper trap  28  Left lateral flexion 24 pain R upper trap   25  Right rotation 33 pain R upper trap  45 pain Rt upper trap 60  Left rotation 48  56 62   (Blank rows = not tested)  UPPER EXTREMITY ROM: WFL's - tight end ranges elevation   Active ROM Right eval Left eval  Shoulder flexion    Shoulder extension    Shoulder abduction    Shoulder adduction    Shoulder extension    Shoulder internal rotation    Shoulder external rotation    Elbow flexion    Elbow extension    Wrist flexion    Wrist extension    Wrist ulnar deviation    Wrist radial deviation    Wrist pronation    Wrist supination     (Blank rows = not tested)  UPPER EXTREMITY MMT:  MMT Right eval Left eval  Shoulder flexion 5 5  Shoulder extension 5 5  Shoulder abduction 5 5  Shoulder adduction    Shoulder extension    Shoulder internal rotation    Shoulder external rotation    Middle trapezius 4 4  Lower trapezius 4 4  Elbow flexion 5 5  Elbow  extension    Wrist flexion    Wrist extension 5 5  Wrist ulnar deviation    Wrist radial deviation    Wrist pronation    Wrist supination    Grip strength WFL's   WFL's   (Blank rows = not tested)  CERVICAL SPECIAL TESTS:  Upper limb tension test (ULTT): Positive, Spurling's test: Negative, and Distraction test: Positive  [facility] Adult PT Treatment:                                                DATE: 09/07/24 Therapeutic Exercise: Cervical ROM measurements  Neuromuscular re-ed: Resisted shoulder extension 2 x 15 red band  Standing rows 2 x 15 green band  Resisted shoulder adduction red band 2 x 10  Prone scapular retraction 2 x 10  Prone T 2 x 10  Seated resisted W 2 x 10  Seated resisted horizontal shoulder abduction red band 2 x 10   Self Care: Sleep positioning recommendations   [facility] Adult PT Treatment:                                                DATE: 09/03/2024 Manual Therapy: STM cervical paraspinals, suboccipitals TPR suboccipital release Manual cervical traction Passive UT/LS stretch (Lt>Rt) Neuromuscular re-ed: Supine cervical retraction with towel roll behind head 10 x 3-5 sec Standing with back against noodle: Resisted W + red TB with scp retraction 2 x 10 Shoulder horizontal abd + red TB --> x10 palms down, x10 palms up Shoulder ER + yellow TB Therapeutic Activity: Seated median nerve glide x 10 Doorway pec stretches + postural cues for proprioceptive awareness --> 60 deg, 90 deg, 120 deg x 30 sec each Radial nerve glide --> didn't feel much Ulnar nerve glide (okay fingers) Wrist flexion stretch on edge of table Supine thoracic extension over 1/2 foam roller   [facility] Adult PT Treatment:                                                DATE: 08/30/24 Therapeutic Exercise: Median nerve glide x 5 Seated thoracic extension x 10  HEP update Manual Therapy: STM Rt upper trap, levator scapulae, posterior rotator cuff, middle trap, rhomboids Suboccipital release CPAs and UPAs grade II-III mid/upper T-spine  Median and ulnar glide (+ median)  Neuromuscular re-ed: Seated W yellow band 2 x 10  Seated resisted horizontal  shoulder abduction yellow band 2 x 10     PATIENT EDUCATION:  Education details: HEP update  Person educated: Patient Education method: Explanation, Demonstration, Tactile cues, Verbal cues, and Handouts Education comprehension: verbalized understanding, returned demonstration, verbal cues required, tactile cues required, and needs further education  HOME EXERCISE PROGRAM: Access Code: [email or web address] URL: [email or web address] Date: 09/07/2024 Prepared by: Lucie Meeter  Exercises - Seated Scapular Retraction  - 2 x daily - 7 x weekly - 1-2 sets - 10 reps - 10 sec  hold - Doorway Pec Stretch at 60 Degrees Abduction  - 3 x daily - 7 x weekly - 1 sets - 3 reps - Doorway Pec Stretch at 90 Degrees Abduction  -  3 x daily - 7 x weekly - 1 sets - 3 reps - 30 seconds  hold - Doorway Pec Stretch at 120 Degrees Abduction  - 3 x daily - 7 x weekly - 1 sets - 3 reps - 30 second hold  hold - Standing Infraspinatus/Teres Minor Release with Ball at Wall  - 2 x daily - 7 x weekly - Standing Pectoral Release with Ball at [employer]  - 3-4 x daily - 7 x weekly - Supine Suboccipital Release with Tennis Balls  - 1 x daily - 7 x weekly - 2 min hold - Hooklying Neck Distraction and Traction  - 1 x daily - 7 x weekly - 1 sets - 10 reps - 5 sec  hold - Seated Thoracic Extension AROM  - 3-4 x daily - 7 x weekly - 1 sets - 3-5 reps - 5-10  sec  hold - Standing Median Nerve Glide  - 1 x daily - 7 x weekly - 1 sets - 10 reps - Shoulder W - External Rotation with Resistance  - 1 x daily - 7 x weekly - 2 sets - 10 reps - Standing Shoulder Horizontal Abduction with Resistance  - 1 x daily - 7 x weekly - 2 sets - 10 reps - Supine Thoracic Mobilization Towel Roll Horizontal  - 2 x daily - 7 x weekly - 1 sets - 1 reps - Supine Thoracic Mobilization Towel Roll Vertical with Arm Stretch  - 2 x daily - 7 x weekly - 1 sets - 5-10 reps - Standing Shoulder Row with Anchored Resistance  - 1 x daily - 7 x weekly - 2 sets -  10 reps  ASSESSMENT:  CLINICAL IMPRESSION: Patient arrives without reports of pain reporting continued improvement in her symptoms. Cervical AROM has improved in all planes only noting pain and paraesthesia at end range of cervical extension. Focused on progression of postural strengthening without onset of pain, though does require frequent cues for proper shoulder/cervical alignment.   EVAL: Patient is a 54 y.o. female who was seen today for physical therapy evaluation and treatment for cervical radiculitis. She reports onset of symptoms ~ 2 weeks ago with no known injury but she does relate that she has been deep coughing for the past month due to bronchitis. She has also been sleeping in her recliner due to bronchitis. Patient has poor posture and alignment; limited cervical ROM, mobility; pain and radicular symptoms with cervical motions; muscular tightness cervical and thoracic musculature; limited positional tolerance; intermittent numbness, tingling R UE shoulder to hand with hand in glove like pattern. Patient has a history of cervical radiculopathy over the past 12 + years with episodes of flare up treated with physical therapy including dry needling. She also has a history of lumbar dysfunction including LBP. Patient reports sedentary lifestyle. She will benefit from PT to address problems identified.   OBJECTIVE IMPAIRMENTS: decreased activity tolerance, decreased mobility, decreased ROM, increased fascial restrictions, increased muscle spasms, impaired UE functional use, improper body mechanics, postural dysfunction, obesity, and pain.    GOALS: Goals reviewed with patient? Yes  SHORT TERM GOALS: Target date: 09/09/2024  Independent in initial HEP  Baseline:  Goal status: MET  2.  Decrease R UE radicular symptoms by 50-70%  Baseline:  09/07/24: has not experienced shooting pain down the RUE recently.  Goal status: MET  3.  Improve posture and alignment with patient to  demonstrate activation of posterior shoudler girdle musculature and improved cervical alignment  Baseline:  09/07/24: demo improved postural awareness  Goal status: MET   LONG TERM GOALS: Target date: 10/07/2024  Decrease pain in cervical and shoulder girdle area as well as R UE by 75% allowing patient to return to normal functional activities  Baseline:  Goal status: INITIAL  2.  Increase cervical ROM by 5-8 deg in lateral flexion and rotation allowing patient to check traffic more easily when driving  Baseline:  Goal status: partially met  3.  Patient reports ability to sleep 4-6 hours in her bed instead of recliner  Baseline:  Goal status: INITIAL  4.  Patient reports and demonstrates proper postures and positions for ADL's  Baseline:  Goal status: INITIAL  5.  Improve NDI by 10% indicating improve functional abilities  Baseline: NDI 30/50; 60% Goal status: INITIAL  6.  Independent in HEP  Baseline:  Goal status: INITIAL   PLAN:  PT FREQUENCY: 2x/week  PT DURATION: 8 weeks  PLANNED INTERVENTIONS: 97164- PT Re-evaluation, 97110-Therapeutic exercises, 97530- Therapeutic activity, 97112- Neuromuscular re-education, 97535- Self Care, [id number]- Manual therapy, Patient/Family education, Taping, and Joint mobilization  PLAN FOR NEXT SESSION: review and progress exercises; continue with spine care and ergonomic education and correction as indicated; manual work and modalities as indicated     Lucie Meeter, PT, DPT, ATC 09/10/2024 7:56 AM   "

## 2024-09-13 ENCOUNTER — Ambulatory Visit: Attending: Family Medicine

## 2024-09-13 DIAGNOSIS — R293 Abnormal posture: Secondary | ICD-10-CM | POA: Insufficient documentation

## 2024-09-13 DIAGNOSIS — M5412 Radiculopathy, cervical region: Secondary | ICD-10-CM | POA: Diagnosis present

## 2024-09-13 DIAGNOSIS — R29898 Other symptoms and signs involving the musculoskeletal system: Secondary | ICD-10-CM | POA: Diagnosis present

## 2024-09-13 NOTE — Therapy (Signed)
 " OUTPATIENT PHYSICAL THERAPY CERVICAL TREATMENT  PHYSICAL THERAPY DISCHARGE SUMMARY  Visits from Start of Care: 7  Current functional level related to goals / functional outcomes: See goals below   Remaining deficits: See impression below   Education / Equipment: See education below    Patient agrees to discharge. Patient goals were mostly met. Patient is being discharged due to being pleased with the current functional level.  Patient Name: Alexandra Greer MRN: [id number] DOB:1971/04/17, 54 y.o., female Today's Date: 09/13/2024  END OF SESSION:  PT End of Session - 09/13/24 0930     Visit Number 7    Number of Visits 16    Date for Recertification  10/07/24    Authorization Type bcbs    Authorization Time Period 45 VISITS PER YEAR    PT Start Time 0930    PT Stop Time 1008    PT Time Calculation (min) 38 min    Activity Tolerance Patient tolerated treatment well    Behavior During Therapy WFL for tasks assessed/performed           Past Medical History:  Diagnosis Date   Allergy As a child   Allergic to nickel   Anxiety    Arthritis 2012   Asthma    Colon polyps    Constipation    Depression    Hypothyroid    Joint pain    Low thyroid  stimulating hormone (TSH) level    Past Surgical History:  Procedure Laterality Date   ABDOMINAL HYSTERECTOMY     APPENDECTOMY  2014   Removed when i had hysterectomy   BREAST SURGERY Left    lumpectomy   Patient Active Problem List   Diagnosis Date Noted   Cervical radiculitis 08/10/2024   Epidermal inclusion cyst 07/30/2024   Hidradenitis suppurativa 04/22/2024   Left wrist pain 12/08/2023   Insomnia 08/14/2023   Grief 02/12/2023   Neck muscle spasm 08/13/2022   Abnormal weight gain 09/13/2021   Class 3 severe obesity due to excess calories without serious comorbidity with body mass index (BMI) of 40.0 to 44.9 in adult [facility]) 09/13/2021   Anxiety 07/10/2021   Family history of colon cancer in father 02/10/2020    H/O: hysterectomy 02/10/2020   Former smoker 02/10/2020   Lumbar spondylosis 02/10/2020   Disc disease, degenerative, cervical 02/10/2020   Mild intermittent asthma without complication 02/10/2020   Mild obstructive sleep apnea 10/28/2018   Subcutaneous nodule of breast 06/20/2016   Hypothyroidism 09/12/2011    PCP: Dr Velma Ku  REFERRING PROVIDER: Dr Velma Ku  REFERRING DIAG: Cervical radiculitis   THERAPY DIAG:  Radiculopathy, cervical region  Abnormal posture  Other symptoms and signs involving the musculoskeletal system  Rationale for Evaluation and Treatment: Rehabilitation  ONSET DATE: 07/29/24  SUBJECTIVE:  SUBJECTIVE STATEMENT: Patient reports she is feeling good. Has noticed that she stays tense when driving on the highway and thinks that plays into the numbness she experiences. Feels that she is ready for discharge.   EVAL: Patient reports onset of R neck and shoulder pain over the past 1-2 weeks with symptoms radiating into the R UE with numbness and tingling as well as some weakness at times. Patient does not known of any accident or injury but does remember that she had congestion and deep coughing due to bronchitis. She has started on prednisone  with some improvement in muscle spasms which were occurring between shoulder blades. She continues to have some numbness and tingling in the R hand.     Hand dominance: Right  PERTINENT HISTORY:  Anxiety; depression; arthritis; joint pain; hysterectomy; asthma; appendectomy; lumpectomy Patient had neck problems including DDD which was treated ~ 6 years ago.  Cervical dysfunction problems started in 2012 with HA and neck pain. She has been treated with PT a few times over the years  She has a history of LBP as well.    PAIN:  Are you having pain? No  PRECAUTIONS: None  RED FLAGS: None     WEIGHT BEARING RESTRICTIONS: No  FALLS:  Has patient fallen in last 6 months? 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  DIAGNOSTIC FINDINGS:  Xray cervical spine 08/10/24 - no results available   PATIENT SURVEYS:  NDI 30/50; 60%   SENSATION: Numbness and tingling R shoulder; ring around biceps area of arm; extensor forearm; whole hand   POSTURE: rounded shoulders, forward head, increased thoracic kyphosis, and flexed trunk     PALPATION: Tightness ant/lat/posterior cervical musculature; upper trap; leveator; pecs;  Decreased mobility thoracic spine   CERVICAL ROM:   Active ROM A/PROM (deg) eval 08/30/24 AROM 09/07/24 AROM 09/13/24  Flexion 35 pull R upper trap  50   Extension 29 pull R upper trap  50; neck pain and tingling in Rt hand   Right lateral flexion 27 pain R upper trap  28 28  Left lateral flexion 24 pain R upper trap   25 28  Right rotation 33 pain R upper trap  45 pain Rt upper trap 60   Left rotation 48  56 62    (Blank rows = not tested)  UPPER EXTREMITY ROM: WFL's - tight end ranges elevation   Active ROM Right eval Left eval  Shoulder flexion    Shoulder extension    Shoulder abduction    Shoulder adduction    Shoulder extension    Shoulder internal rotation    Shoulder external rotation    Elbow flexion    Elbow extension     Wrist flexion    Wrist extension    Wrist ulnar deviation    Wrist radial deviation    Wrist pronation    Wrist supination     (Blank rows = not tested)  UPPER EXTREMITY MMT:  MMT Right eval Left eval  Shoulder flexion 5 5  Shoulder extension 5 5  Shoulder abduction 5 5  Shoulder adduction    Shoulder extension    Shoulder internal rotation    Shoulder external rotation    Middle trapezius 4 4  Lower trapezius 4 4  Elbow flexion 5 5  Elbow extension    Wrist flexion    Wrist extension 5 5  Wrist ulnar deviation    Wrist radial deviation    Wrist pronation    Wrist supination    Grip strength WFL's  WFL's   (Blank rows = not tested)  CERVICAL SPECIAL TESTS:  Upper limb tension test (ULTT): Positive, Spurling's test: Negative, and Distraction test: Positive [facility] Adult PT Treatment:                                                DATE: 09/13/24 Therapeutic Exercise: Reviewed and updated HEP discussing frequency, sets, reps, and ways to progress independently Performed as needed  Therapeutic Activity: Re-assessment to determine overall progress, educating patient on progress towards goals  Self Care: Discussed sleep positioning recommendations and pillow options  [facility] Adult PT Treatment:                                                DATE: 09/07/24 Therapeutic Exercise: Cervical ROM measurements  Neuromuscular re-ed: Resisted shoulder extension 2 x 15 red band  Standing rows 2 x 15 green band  Resisted shoulder adduction red band 2 x 10  Prone scapular retraction 2 x 10  Prone T 2 x 10  Seated resisted W 2 x 10  Seated resisted horizontal shoulder abduction red band 2 x 10   Self Care: Sleep positioning recommendations   [facility] Adult PT Treatment:                                                DATE: 09/03/2024 Manual Therapy: STM cervical paraspinals, suboccipitals TPR suboccipital release Manual cervical traction Passive UT/LS stretch  (Lt>Rt) Neuromuscular re-ed: Supine cervical retraction with towel roll behind head 10 x 3-5 sec Standing with back against noodle: Resisted W + red TB with scp retraction 2 x 10 Shoulder horizontal abd + red TB --> x10 palms down, x10 palms up Shoulder ER + yellow TB Therapeutic Activity: Seated median nerve glide x 10 Doorway pec stretches + postural cues for proprioceptive awareness --> 60 deg, 90 deg, 120 deg x 30 sec each Radial nerve glide --> didn't feel much Ulnar nerve glide (okay fingers) Wrist flexion stretch on edge of table Supine thoracic extension over 1/2 foam roller   [facility] Adult PT Treatment:                                                DATE: 08/30/24 Therapeutic Exercise: Median nerve glide x 5 Seated thoracic extension x 10  HEP update Manual Therapy: STM Rt upper trap, levator scapulae, posterior rotator cuff,  middle trap, rhomboids Suboccipital release CPAs and UPAs grade II-III mid/upper T-spine  Median and ulnar glide (+ median)  Neuromuscular re-ed: Seated W yellow band 2 x 10  Seated resisted horizontal shoulder abduction yellow band 2 x 10     PATIENT EDUCATION:  Education details: see treatment; D/C Person educated: Patient Education method: Explanation, Demonstration, and Handouts Education comprehension: verbalized understanding and returned demonstration  HOME EXERCISE PROGRAM: Access Code: [email or web address] URL: [email or web address] Date: 09/13/2024 Prepared by: Lucie Meeter  Exercises - Doorway Pec Stretch at 60 Degrees Abduction  - 1 x daily - 7 x weekly - 1 sets - 3 reps - 30 sec  hold - Doorway Pec Stretch at 90 Degrees Abduction  - 1 x daily - 7 x weekly - 1 sets - 3 reps - 30 seconds  hold - Doorway Pec Stretch at 120 Degrees Abduction  - 1 x daily - 7 x weekly - 1 sets - 3 reps - 30 second hold  hold - Seated Scapular Retraction  - 1 x daily - 7 x weekly - 1-2 sets - 10 reps - 10 sec  hold - Seated Thoracic Lumbar  Extension with Pectoralis Stretch  - 1 x daily - 7 x weekly - 1 sets - 10 reps - 5 sec  hold - Standing Median Nerve Glide  - 1 x daily - 7 x weekly - 1 sets - 10 reps - Shoulder W - External Rotation with Resistance  - 1 x daily - 3 x weekly - 2 sets - 10 reps - Standing Shoulder Horizontal Abduction with Resistance  - 1 x daily - 3 x weekly - 2 sets - 10 reps - Standing Shoulder Row with Anchored Resistance  - 1 x daily - 3 x weekly - 2 sets - 10 reps - Standing Infraspinatus/Teres Minor Release with Ball at Wall  - 1 x daily - 7 x weekly - Standing Pectoral Release with Ball at [employer]  - 1 x daily - 7 x weekly - Hooklying Neck Distraction and Traction  - 1 x daily - 7 x weekly - 1 sets - 10 reps - 5 sec  hold - Supine Suboccipital Release with Tennis Balls  - 1 x daily - 7 x weekly - 2 min hold  ASSESSMENT:  CLINICAL IMPRESSION: Silver has made excellent progress in PT reporting resolution of her neck/shoulder pain. She has occasional, intermittent paraesthesia into the Rt hand and believes this is posture related. She has met all established functional goals with exception of lateral flexion ROM goal. She demonstrates independence with advanced home program and is therefore appropriate for discharge at this time with patient in agreement with this plan.   EVAL: Patient is a 54 y.o. female who was seen today for physical therapy evaluation and treatment for cervical radiculitis. She reports onset of symptoms ~ 2 weeks ago with no known injury but she does relate that she has been deep coughing for the past month due to bronchitis. She has also been sleeping in her recliner due to bronchitis. Patient has poor posture and alignment; limited cervical ROM, mobility; pain and radicular symptoms with cervical motions; muscular tightness cervical and thoracic musculature; limited positional tolerance; intermittent numbness, tingling R UE shoulder to hand with hand in glove like pattern. Patient has a  history of cervical radiculopathy over the past 12 + years with episodes of flare up treated with physical therapy including dry needling. She also has a history of lumbar dysfunction including LBP.  Patient reports sedentary lifestyle. She will benefit from PT to address problems identified.   OBJECTIVE IMPAIRMENTS: decreased activity tolerance, decreased mobility, decreased ROM, increased fascial restrictions, increased muscle spasms, impaired UE functional use, improper body mechanics, postural dysfunction, obesity, and pain.    GOALS: Goals reviewed with patient? Yes  SHORT TERM GOALS: Target date: 09/09/2024  Independent in initial HEP  Baseline:  Goal status: MET  2.  Decrease R UE radicular symptoms by 50-70%  Baseline:  09/07/24: has not experienced shooting pain down the RUE recently.  Goal status: MET  3.  Improve posture and alignment with patient to demonstrate activation of posterior shoudler girdle musculature and improved cervical alignment  Baseline:  09/07/24: demo improved postural awareness  Goal status: MET   LONG TERM GOALS: Target date: 10/07/2024  Decrease pain in cervical and shoulder girdle area as well as R UE by 75% allowing patient to return to normal functional activities  Baseline:  09/13/24: reports full resolution  Goal status: MET  2.  Increase cervical ROM by 5-8 deg in lateral flexion and rotation allowing patient to check traffic more easily when driving  Baseline:  Goal status: partially met  3.  Patient reports ability to sleep 4-6 hours in her bed instead of recliner  Baseline:  Goal status: MET  4.  Patient reports and demonstrates proper postures and positions for ADL's  Baseline:  09/13/24: improved postural awareness  Goal status: MET  5.  Improve NDI by 10% indicating improve functional abilities  Baseline: NDI 30/50; 60% 09/13/24: 1/50; 2%  Goal status: MET  6.  Independent in HEP  Baseline:  Goal status: MET   PLAN:  PT  FREQUENCY: 2x/week  PT DURATION: 8 weeks  PLANNED INTERVENTIONS: 97164- PT Re-evaluation, 97110-Therapeutic exercises, 97530- Therapeutic activity, 97112- Neuromuscular re-education, 97535- Self Care, [id number]- Manual therapy, Patient/Family education, Taping, and Joint mobilization      Lucie Meeter, PT, DPT, ATC 09/13/2024 10:09 AM   "

## 2024-09-19 ENCOUNTER — Other Ambulatory Visit: Payer: Self-pay | Admitting: Family Medicine

## 2024-09-21 ENCOUNTER — Telehealth: Payer: Self-pay

## 2024-09-21 NOTE — Telephone Encounter (Signed)
 Copied from CRM #[id number]. Topic: Clinical - Medication Question >> Sep 21, 2024 11:09 AM Antwanette L wrote: Reason for CRM: The pt called to request a refill on  ALPRAZolam  (XANAX ) 0.25 MG tablet. There is a pending order dated 09/19/24. The pt was informed that refills may take up to 3 business days and the should follow up with the pharmacy.
